# Patient Record
Sex: Female | Born: 1960 | Race: White | Hispanic: No | Smoking: Current some day smoker
Health system: Southern US, Community
[De-identification: ages and names within clinical notes are randomized; demographics above are authoritative.]

## PROBLEM LIST (undated history)

## (undated) DIAGNOSIS — N200 Calculus of kidney: Secondary | ICD-10-CM

## (undated) DIAGNOSIS — M1990 Unspecified osteoarthritis, unspecified site: Secondary | ICD-10-CM

## (undated) DIAGNOSIS — C4491 Basal cell carcinoma of skin, unspecified: Secondary | ICD-10-CM

## (undated) HISTORY — PX: VAGINAL HYSTERECTOMY: SUR661

## (undated) HISTORY — PX: KNEE SURGERY: SHX244

## (undated) HISTORY — PX: JOINT REPLACEMENT: SHX530

## (undated) HISTORY — PX: KNEE ARTHROSCOPY: SHX127

---

## 1989-08-08 HISTORY — PX: LAPAROSCOPIC CHOLECYSTECTOMY: SUR755

## 2002-12-08 HISTORY — PX: TOTAL KNEE ARTHROPLASTY: SHX125

## 2014-12-04 ENCOUNTER — Other Ambulatory Visit (HOSPITAL_COMMUNITY): Payer: Self-pay | Admitting: Orthopedic Surgery

## 2014-12-08 HISTORY — PX: SKIN CANCER EXCISION: SHX779

## 2014-12-14 NOTE — Pre-Procedure Instructions (Signed)
Theresa Michael  12/14/2014   Your procedure is scheduled on: Wednesday, Jan. 20th    Report to [facility] Admitting at 6:30 AM.   Call this number if you have problems the morning of surgery: [phone]   Remember:   Do not eat food or drink liquids after midnight Tuesday.   Take these medicines the morning of surgery with A SIP OF WATER: Effexor   Do not wear jewelry, make-up or nail polish.  Do not wear lotions, powders, or perfumes. You may NOT wear deodorant the day of surgery.  Do not shave underarms & legs 48 hours prior to surgery.   Do not bring valuables to the hospital.  [facility] is not responsible for any belongings or valuables.               Contacts, dentures or bridgework may not be worn into surgery.  Leave suitcase in the car. After surgery it may be brought to your room.  For patients admitted to the hospital, discharge time is determined by your treatment team.               Name and phone number of your driver:    Special Instructions: "Preparing for Surgery" instruction sheet.   Please read over the following fact sheets that you were given: Pain Booklet, Coughing and Deep Breathing, MRSA Information and Surgical Site Infection Prevention

## 2014-12-15 ENCOUNTER — Encounter (HOSPITAL_COMMUNITY)
Admission: RE | Admit: 2014-12-15 | Discharge: 2014-12-15 | Disposition: A | Discharge status: Home / Self Care | Payer: BLUE CROSS/BLUE SHIELD | Source: Ambulatory Visit | Attending: Orthopedic Surgery | Admitting: Orthopedic Surgery

## 2014-12-15 ENCOUNTER — Encounter (HOSPITAL_COMMUNITY): Payer: Self-pay

## 2014-12-15 DIAGNOSIS — T84033A Mechanical loosening of internal left knee prosthetic joint, initial encounter: Secondary | ICD-10-CM | POA: Insufficient documentation

## 2014-12-15 DIAGNOSIS — Z01818 Encounter for other preprocedural examination: Secondary | ICD-10-CM | POA: Insufficient documentation

## 2014-12-15 HISTORY — DX: Calculus of kidney: N20.0

## 2014-12-15 LAB — COMPREHENSIVE METABOLIC PANEL
ALBUMIN: 4.4 g/dL (ref 3.5–5.2)
ALT: 20 U/L (ref 0–35)
AST: 16 U/L (ref 0–37)
Alkaline Phosphatase: 111 U/L (ref 39–117)
Anion gap: 11 (ref 5–15)
BUN: 11 mg/dL (ref 6–23)
CO2: 23 mmol/L (ref 19–32)
Calcium: 9.7 mg/dL (ref 8.4–10.5)
Chloride: 105 mEq/L (ref 96–112)
Creatinine, Ser: 0.81 mg/dL (ref 0.50–1.10)
GFR calc Af Amer: >90 mL/min (ref >90)
GFR calc non Af Amer: 80 mL/min — ABNORMAL LOW (ref >90)
Glucose, Bld: 88 mg/dL (ref 70–99)
Potassium: 4.2 mmol/L (ref 3.5–5.1)
SODIUM: 139 mmol/L (ref 135–145)
TOTAL PROTEIN: 7.3 g/dL (ref 6.0–8.3)
Total Bilirubin: 0.6 mg/dL (ref 0.3–1.2)

## 2014-12-15 LAB — CBC
HCT: 44.8 % (ref 36.0–46.0)
Hemoglobin: 15.3 g/dL — ABNORMAL HIGH (ref 12.0–15.0)
MCH: 31.6 pg (ref 26.0–34.0)
MCHC: 34.2 g/dL (ref 30.0–36.0)
MCV: 92.6 fL (ref 78.0–100.0)
PLATELETS: 341 10*3/uL (ref 150–400)
RBC: 4.84 MIL/uL (ref 3.87–5.11)
RDW: 12.8 % (ref 11.5–15.5)
WBC: 10.2 10*3/uL (ref 4.0–10.5)

## 2014-12-15 LAB — PROTIME-INR
INR: 0.97 (ref 0.00–1.49)
Prothrombin Time: 13 seconds (ref 11.6–15.2)

## 2014-12-15 LAB — SURGICAL PCR SCREEN
MRSA, PCR: NEGATIVE
STAPHYLOCOCCUS AUREUS: NEGATIVE

## 2014-12-15 LAB — APTT: APTT: 37 s (ref 24–37)

## 2014-12-15 NOTE — Progress Notes (Signed)
Denies any cardiac issues.

## 2014-12-26 MED ORDER — CEFAZOLIN SODIUM-DEXTROSE 2-3 GM-% IV SOLR
2.0000 g | INTRAVENOUS | Status: AC
  Administered 2014-12-27: 2 g via INTRAVENOUS
  Filled 2014-12-26: qty 50

## 2014-12-27 ENCOUNTER — Encounter (HOSPITAL_COMMUNITY)
Admission: RE | Disposition: A | Discharge status: Home / Self Care | Payer: Self-pay | Source: Ambulatory Visit | Attending: Orthopedic Surgery

## 2014-12-27 ENCOUNTER — Inpatient Hospital Stay (HOSPITAL_COMMUNITY): Payer: BLUE CROSS/BLUE SHIELD | Admitting: Anesthesiology

## 2014-12-27 ENCOUNTER — Inpatient Hospital Stay (HOSPITAL_COMMUNITY)
Admission: RE | Admit: 2014-12-27 | Discharge: 2014-12-28 | DRG: 468 | Disposition: A | Discharge status: Home / Self Care | Payer: BLUE CROSS/BLUE SHIELD | Source: Ambulatory Visit | Attending: Orthopedic Surgery | Admitting: Orthopedic Surgery

## 2014-12-27 ENCOUNTER — Encounter (HOSPITAL_COMMUNITY): Payer: Self-pay | Admitting: *Deleted

## 2014-12-27 DIAGNOSIS — Y792 Prosthetic and other implants, materials and accessory orthopedic devices associated with adverse incidents: Secondary | ICD-10-CM | POA: Diagnosis present

## 2014-12-27 DIAGNOSIS — M25562 Pain in left knee: Secondary | ICD-10-CM | POA: Diagnosis present

## 2014-12-27 DIAGNOSIS — Z85828 Personal history of other malignant neoplasm of skin: Secondary | ICD-10-CM

## 2014-12-27 DIAGNOSIS — F17210 Nicotine dependence, cigarettes, uncomplicated: Secondary | ICD-10-CM | POA: Diagnosis present

## 2014-12-27 DIAGNOSIS — Z886 Allergy status to analgesic agent status: Secondary | ICD-10-CM | POA: Diagnosis not present

## 2014-12-27 DIAGNOSIS — Z87442 Personal history of urinary calculi: Secondary | ICD-10-CM | POA: Diagnosis not present

## 2014-12-27 DIAGNOSIS — Z9049 Acquired absence of other specified parts of digestive tract: Secondary | ICD-10-CM | POA: Diagnosis present

## 2014-12-27 DIAGNOSIS — T84093A Other mechanical complication of internal left knee prosthesis, initial encounter: Secondary | ICD-10-CM | POA: Diagnosis present

## 2014-12-27 DIAGNOSIS — Z882 Allergy status to sulfonamides status: Secondary | ICD-10-CM | POA: Diagnosis not present

## 2014-12-27 DIAGNOSIS — M1712 Unilateral primary osteoarthritis, left knee: Secondary | ICD-10-CM | POA: Diagnosis present

## 2014-12-27 DIAGNOSIS — Z90710 Acquired absence of both cervix and uterus: Secondary | ICD-10-CM | POA: Diagnosis not present

## 2014-12-27 DIAGNOSIS — Z96659 Presence of unspecified artificial knee joint: Secondary | ICD-10-CM

## 2014-12-27 HISTORY — PX: TOTAL KNEE REVISION: SHX996

## 2014-12-27 HISTORY — DX: Unspecified osteoarthritis, unspecified site: M19.90

## 2014-12-27 HISTORY — DX: Basal cell carcinoma of skin, unspecified: C44.91

## 2014-12-27 SURGERY — TOTAL KNEE REVISION
Anesthesia: Regional | Laterality: Left

## 2014-12-27 MED ORDER — HYDROMORPHONE HCL 1 MG/ML IJ SOLN
INTRAMUSCULAR | Status: AC
  Filled 2014-12-27: qty 1

## 2014-12-27 MED ORDER — FENTANYL CITRATE 0.05 MG/ML IJ SOLN
INTRAMUSCULAR | Status: DC | PRN
  Administered 2014-12-27: 50 ug via INTRAVENOUS
  Administered 2014-12-27: 100 ug via INTRAVENOUS
  Administered 2014-12-27 (×2): 50 ug via INTRAVENOUS

## 2014-12-27 MED ORDER — PROPOFOL 10 MG/ML IV BOLUS
INTRAVENOUS | Status: AC
  Filled 2014-12-27: qty 20

## 2014-12-27 MED ORDER — ASPIRIN EC 325 MG PO TBEC
325.0000 mg | DELAYED_RELEASE_TABLET | Freq: Every day | ORAL | Status: DC
  Administered 2014-12-28: 325 mg via ORAL
  Filled 2014-12-27 (×2): qty 1

## 2014-12-27 MED ORDER — ONDANSETRON HCL 4 MG PO TABS: 4.0000 mg | ORAL_TABLET | Freq: Four times a day (QID) | ORAL | Status: DC | PRN

## 2014-12-27 MED ORDER — ROCURONIUM BROMIDE 50 MG/5ML IV SOLN
INTRAVENOUS | Status: AC
  Filled 2014-12-27: qty 1

## 2014-12-27 MED ORDER — SODIUM CHLORIDE 0.9 % IV SOLN
INTRAVENOUS | Status: DC
  Administered 2014-12-27: 16:00:00 via INTRAVENOUS

## 2014-12-27 MED ORDER — METHOCARBAMOL 1000 MG/10ML IJ SOLN
500.0000 mg | INTRAVENOUS | Status: DC
  Filled 2014-12-27: qty 5

## 2014-12-27 MED ORDER — METHOCARBAMOL 500 MG PO TABS
ORAL_TABLET | ORAL | Status: AC
  Filled 2014-12-27: qty 1

## 2014-12-27 MED ORDER — LIDOCAINE HCL (CARDIAC) 20 MG/ML IV SOLN
INTRAVENOUS | Status: DC | PRN
  Administered 2014-12-27: 80 mg via INTRAVENOUS
  Administered 2014-12-27: 20 mg via INTRAVENOUS

## 2014-12-27 MED ORDER — PHENYLEPHRINE HCL 10 MG/ML IJ SOLN
10.0000 mg | INTRAVENOUS | Status: DC | PRN
  Administered 2014-12-27: 20 ug/min via INTRAVENOUS

## 2014-12-27 MED ORDER — METOCLOPRAMIDE HCL 10 MG PO TABS: 5.0000 mg | ORAL_TABLET | Freq: Three times a day (TID) | ORAL | Status: DC | PRN

## 2014-12-27 MED ORDER — PHENYLEPHRINE 40 MCG/ML (10ML) SYRINGE FOR IV PUSH (FOR BLOOD PRESSURE SUPPORT)
PREFILLED_SYRINGE | INTRAVENOUS | Status: AC
  Filled 2014-12-27: qty 10

## 2014-12-27 MED ORDER — GLYCOPYRROLATE 0.2 MG/ML IJ SOLN
INTRAMUSCULAR | Status: DC | PRN
  Administered 2014-12-27: 0.2 mg via INTRAVENOUS
  Administered 2014-12-27: 0.6 mg via INTRAVENOUS

## 2014-12-27 MED ORDER — DEXTROSE 5 % IV SOLN: 500.0000 mg | Freq: Four times a day (QID) | INTRAVENOUS | Status: DC | PRN

## 2014-12-27 MED ORDER — MAGNESIUM CITRATE PO SOLN: 1.0000 | Freq: Once | ORAL | Status: AC | PRN

## 2014-12-27 MED ORDER — HYDROMORPHONE HCL 1 MG/ML IJ SOLN
0.2500 mg | INTRAMUSCULAR | Status: DC | PRN
  Administered 2014-12-27 (×4): 0.5 mg via INTRAVENOUS

## 2014-12-27 MED ORDER — NEOSTIGMINE METHYLSULFATE 10 MG/10ML IV SOLN
INTRAVENOUS | Status: DC | PRN
  Administered 2014-12-27: 4 mg via INTRAVENOUS

## 2014-12-27 MED ORDER — SODIUM CHLORIDE 0.9 % IR SOLN
Status: DC | PRN
  Administered 2014-12-27 (×5): 1000 mL

## 2014-12-27 MED ORDER — GLYCOPYRROLATE 0.2 MG/ML IJ SOLN
INTRAMUSCULAR | Status: AC
  Filled 2014-12-27: qty 3

## 2014-12-27 MED ORDER — HYDROMORPHONE HCL 1 MG/ML IJ SOLN
INTRAMUSCULAR | Status: AC
  Administered 2014-12-27: 0.5 mg via INTRAVENOUS
  Filled 2014-12-27: qty 1

## 2014-12-27 MED ORDER — ONDANSETRON HCL 4 MG/2ML IJ SOLN
INTRAMUSCULAR | Status: DC | PRN
  Administered 2014-12-27: 4 mg via INTRAVENOUS

## 2014-12-27 MED ORDER — NEOSTIGMINE METHYLSULFATE 10 MG/10ML IV SOLN
INTRAVENOUS | Status: AC
  Filled 2014-12-27: qty 1

## 2014-12-27 MED ORDER — ONDANSETRON HCL 4 MG/2ML IJ SOLN
INTRAMUSCULAR | Status: AC
  Filled 2014-12-27: qty 2

## 2014-12-27 MED ORDER — OXYCODONE HCL 5 MG PO TABS
5.0000 mg | ORAL_TABLET | ORAL | Status: DC | PRN
  Administered 2014-12-27 – 2014-12-28 (×4): 10 mg via ORAL
  Filled 2014-12-27 (×3): qty 2

## 2014-12-27 MED ORDER — ACETAMINOPHEN 650 MG RE SUPP: 650.0000 mg | Freq: Four times a day (QID) | RECTAL | Status: DC | PRN

## 2014-12-27 MED ORDER — VENLAFAXINE HCL ER 75 MG PO CP24
75.0000 mg | ORAL_CAPSULE | Freq: Every day | ORAL | Status: DC
  Administered 2014-12-28: 75 mg via ORAL
  Filled 2014-12-27 (×2): qty 1

## 2014-12-27 MED ORDER — ONDANSETRON HCL 4 MG/2ML IJ SOLN: 4.0000 mg | Freq: Four times a day (QID) | INTRAMUSCULAR | Status: DC | PRN

## 2014-12-27 MED ORDER — ACETAMINOPHEN 325 MG PO TABS
ORAL_TABLET | ORAL | Status: AC
  Filled 2014-12-27: qty 2

## 2014-12-27 MED ORDER — METHOCARBAMOL 500 MG PO TABS
500.0000 mg | ORAL_TABLET | Freq: Four times a day (QID) | ORAL | Status: DC | PRN
  Administered 2014-12-27 – 2014-12-28 (×3): 500 mg via ORAL
  Filled 2014-12-27 (×2): qty 1

## 2014-12-27 MED ORDER — METOCLOPRAMIDE HCL 5 MG/ML IJ SOLN
5.0000 mg | Freq: Three times a day (TID) | INTRAMUSCULAR | Status: DC | PRN
Start: 2014-12-27 — End: 2014-12-28
  Administered 2014-12-27 – 2014-12-28 (×2): 10 mg via INTRAVENOUS
  Filled 2014-12-27 (×2): qty 2

## 2014-12-27 MED ORDER — OXYCODONE HCL 5 MG/5ML PO SOLN
5.0000 mg | Freq: Once | ORAL | Status: DC | PRN
Start: 2014-12-27 — End: 2014-12-27

## 2014-12-27 MED ORDER — LIDOCAINE HCL (CARDIAC) 20 MG/ML IV SOLN
INTRAVENOUS | Status: AC
  Filled 2014-12-27: qty 5

## 2014-12-27 MED ORDER — HYDROMORPHONE HCL 1 MG/ML IJ SOLN
1.0000 mg | INTRAMUSCULAR | Status: DC | PRN
  Administered 2014-12-27 – 2014-12-28 (×4): 1 mg via INTRAVENOUS
  Filled 2014-12-27 (×5): qty 1

## 2014-12-27 MED ORDER — MENTHOL 3 MG MT LOZG: 1.0000 | LOZENGE | OROMUCOSAL | Status: DC | PRN

## 2014-12-27 MED ORDER — MIDAZOLAM HCL 5 MG/5ML IJ SOLN
INTRAMUSCULAR | Status: DC | PRN
  Administered 2014-12-27: 2 mg via INTRAVENOUS

## 2014-12-27 MED ORDER — 0.9 % SODIUM CHLORIDE (POUR BTL) OPTIME
TOPICAL | Status: DC | PRN
  Administered 2014-12-27: 1000 mL

## 2014-12-27 MED ORDER — FENTANYL CITRATE 0.05 MG/ML IJ SOLN
INTRAMUSCULAR | Status: AC
  Filled 2014-12-27: qty 5

## 2014-12-27 MED ORDER — CEFAZOLIN SODIUM 1-5 GM-% IV SOLN
1.0000 g | Freq: Four times a day (QID) | INTRAVENOUS | Status: AC
  Administered 2014-12-27 (×2): 1 g via INTRAVENOUS
  Filled 2014-12-27 (×2): qty 50

## 2014-12-27 MED ORDER — ALBUMIN HUMAN 5 % IV SOLN
INTRAVENOUS | Status: DC | PRN
  Administered 2014-12-27: 09:00:00 via INTRAVENOUS

## 2014-12-27 MED ORDER — BUPIVACAINE LIPOSOME 1.3 % IJ SUSP
20.0000 mL | INTRAMUSCULAR | Status: AC
  Administered 2014-12-27: 20 mL
  Filled 2014-12-27: qty 20

## 2014-12-27 MED ORDER — SODIUM CHLORIDE 0.9 % IJ SOLN
INTRAMUSCULAR | Status: AC
  Filled 2014-12-27: qty 10

## 2014-12-27 MED ORDER — PHENOL 1.4 % MT LIQD: 1.0000 | OROMUCOSAL | Status: DC | PRN

## 2014-12-27 MED ORDER — SUCCINYLCHOLINE CHLORIDE 20 MG/ML IJ SOLN
INTRAMUSCULAR | Status: AC
  Filled 2014-12-27: qty 1

## 2014-12-27 MED ORDER — SUCCINYLCHOLINE CHLORIDE 20 MG/ML IJ SOLN
INTRAMUSCULAR | Status: DC | PRN
  Administered 2014-12-27: 100 mg via INTRAVENOUS

## 2014-12-27 MED ORDER — OXYCODONE HCL 5 MG PO TABS: 5.0000 mg | ORAL_TABLET | Freq: Once | ORAL | Status: DC | PRN

## 2014-12-27 MED ORDER — ROCURONIUM BROMIDE 100 MG/10ML IV SOLN
INTRAVENOUS | Status: DC | PRN
  Administered 2014-12-27: 50 mg via INTRAVENOUS

## 2014-12-27 MED ORDER — PROPOFOL 10 MG/ML IV BOLUS
INTRAVENOUS | Status: DC | PRN
  Administered 2014-12-27: 200 mg via INTRAVENOUS

## 2014-12-27 MED ORDER — EPHEDRINE SULFATE 50 MG/ML IJ SOLN
INTRAMUSCULAR | Status: AC
  Filled 2014-12-27: qty 1

## 2014-12-27 MED ORDER — BISACODYL 5 MG PO TBEC: 5.0000 mg | DELAYED_RELEASE_TABLET | Freq: Every day | ORAL | Status: DC | PRN

## 2014-12-27 MED ORDER — DOCUSATE SODIUM 100 MG PO CAPS
100.0000 mg | ORAL_CAPSULE | Freq: Two times a day (BID) | ORAL | Status: DC
  Administered 2014-12-27 – 2014-12-28 (×3): 100 mg via ORAL
  Filled 2014-12-27 (×2): qty 1

## 2014-12-27 MED ORDER — PHENYLEPHRINE HCL 10 MG/ML IJ SOLN
INTRAMUSCULAR | Status: DC | PRN
  Administered 2014-12-27: 80 ug via INTRAVENOUS
  Administered 2014-12-27: 40 ug via INTRAVENOUS
  Administered 2014-12-27 (×3): 80 ug via INTRAVENOUS
  Administered 2014-12-27: 40 ug via INTRAVENOUS

## 2014-12-27 MED ORDER — SENNOSIDES-DOCUSATE SODIUM 8.6-50 MG PO TABS: 1.0000 | ORAL_TABLET | Freq: Every evening | ORAL | Status: DC | PRN

## 2014-12-27 MED ORDER — CHLORHEXIDINE GLUCONATE 4 % EX LIQD
60.0000 mL | Freq: Once | CUTANEOUS | Status: DC
  Filled 2014-12-27: qty 60

## 2014-12-27 MED ORDER — ACETAMINOPHEN 325 MG PO TABS
650.0000 mg | ORAL_TABLET | Freq: Four times a day (QID) | ORAL | Status: DC | PRN
  Administered 2014-12-27: 650 mg via ORAL

## 2014-12-27 MED ORDER — OXYCODONE HCL 5 MG PO TABS
ORAL_TABLET | ORAL | Status: AC
  Filled 2014-12-27: qty 2

## 2014-12-27 MED ORDER — LACTATED RINGERS IV SOLN
INTRAVENOUS | Status: DC | PRN
  Administered 2014-12-27 (×3): via INTRAVENOUS

## 2014-12-27 MED ORDER — ONDANSETRON HCL 4 MG/2ML IJ SOLN
4.0000 mg | Freq: Four times a day (QID) | INTRAMUSCULAR | Status: DC | PRN
  Administered 2014-12-27: 4 mg via INTRAVENOUS
  Filled 2014-12-27: qty 2

## 2014-12-27 MED ORDER — BUPIVACAINE-EPINEPHRINE (PF) 0.5% -1:200000 IJ SOLN
INTRAMUSCULAR | Status: DC | PRN
  Administered 2014-12-27: 30 mL via PERINEURAL

## 2014-12-27 MED ORDER — ARTIFICIAL TEARS OP OINT
TOPICAL_OINTMENT | OPHTHALMIC | Status: DC | PRN
  Administered 2014-12-27: 1 via OPHTHALMIC

## 2014-12-27 MED ORDER — MIDAZOLAM HCL 2 MG/2ML IJ SOLN
INTRAMUSCULAR | Status: AC
  Filled 2014-12-27: qty 2

## 2014-12-27 MED ORDER — ARTIFICIAL TEARS OP OINT
TOPICAL_OINTMENT | OPHTHALMIC | Status: AC
  Filled 2014-12-27: qty 3.5

## 2014-12-27 SURGICAL SUPPLY — 58 items
BLADE SAW SAG 90X13X1.27 (BLADE) ×2 IMPLANT
BLADE SAW SGTL 13.0X1.19X90.0M (BLADE) IMPLANT
BLADE SAW SGTL 81X20 HD (BLADE) ×2 IMPLANT
BLADE SURG 10 STRL SS (BLADE) ×8 IMPLANT
BNDG COHESIVE 6X5 TAN STRL LF (GAUZE/BANDAGES/DRESSINGS) ×2 IMPLANT
BNDG GAUZE ELAST 4 BULKY (GAUZE/BANDAGES/DRESSINGS) ×2 IMPLANT
BONE CEMENT PALACOSE (Orthopedic Implant) ×4 IMPLANT
BOWL SMART MIX CTS (DISPOSABLE) ×2 IMPLANT
CEMENT BONE PALACOSE (Orthopedic Implant) ×2 IMPLANT
CONNECTOR OFFSET KNEE 3MM (Knees) ×2 IMPLANT
COVER BACK TABLE 24X17X13 BIG (DRAPES) IMPLANT
COVER SURGICAL LIGHT HANDLE (MISCELLANEOUS) ×2 IMPLANT
CUFF TOURNIQUET SINGLE 34IN LL (TOURNIQUET CUFF) ×2 IMPLANT
CUFF TOURNIQUET SINGLE 44IN (TOURNIQUET CUFF) IMPLANT
DRAPE EXTREMITY T 121X128X90 (DRAPE) ×2 IMPLANT
DRAPE IMP U-DRAPE 54X76 (DRAPES) ×2 IMPLANT
DRAPE PROXIMA HALF (DRAPES) ×2 IMPLANT
DRAPE U-SHAPE 47X51 STRL (DRAPES) ×2 IMPLANT
DRSG ADAPTIC 3X8 NADH LF (GAUZE/BANDAGES/DRESSINGS) ×2 IMPLANT
DRSG PAD ABDOMINAL 8X10 ST (GAUZE/BANDAGES/DRESSINGS) ×2 IMPLANT
DURAPREP 26ML APPLICATOR (WOUND CARE) ×2 IMPLANT
ELECT REM PT RETURN 9FT ADLT (ELECTROSURGICAL) ×2
ELECTRODE REM PT RTRN 9FT ADLT (ELECTROSURGICAL) ×1 IMPLANT
FACESHIELD WRAPAROUND (MASK) ×2 IMPLANT
GAUZE SPONGE 4X4 12PLY STRL (GAUZE/BANDAGES/DRESSINGS) ×2 IMPLANT
GLOVE BIOGEL PI IND STRL 9 (GLOVE) ×1 IMPLANT
GLOVE BIOGEL PI INDICATOR 9 (GLOVE) ×1
GLOVE SURG ORTHO 9.0 STRL STRW (GLOVE) ×2 IMPLANT
GOWN STRL REUS W/ TWL XL LVL3 (GOWN DISPOSABLE) ×3 IMPLANT
GOWN STRL REUS W/TWL XL LVL3 (GOWN DISPOSABLE) ×3
HANDPIECE INTERPULSE COAX TIP (DISPOSABLE) ×1
INSERT TIBIAL KNEE FIX SZ3 17 (Knees) ×2 IMPLANT
KIT BASIN OR (CUSTOM PROCEDURE TRAY) ×2 IMPLANT
KIT ROOM TURNOVER OR (KITS) ×2 IMPLANT
KNEE FEMORAL COMPONENT SZ2 LT (Knees) ×2 IMPLANT
MANIFOLD NEPTUNE II (INSTRUMENTS) ×2 IMPLANT
NEEDLE SPNL 18GX3.5 QUINCKE PK (NEEDLE) ×2 IMPLANT
NS IRRIG 1000ML POUR BTL (IV SOLUTION) ×2 IMPLANT
PACK TOTAL JOINT (CUSTOM PROCEDURE TRAY) ×2 IMPLANT
PACK UNIVERSAL I (CUSTOM PROCEDURE TRAY) ×2 IMPLANT
PAD ARMBOARD 7.5X6 YLW CONV (MISCELLANEOUS) ×4 IMPLANT
PADDING CAST COTTON 6X4 STRL (CAST SUPPLIES) ×2 IMPLANT
SET HNDPC FAN SPRY TIP SCT (DISPOSABLE) ×1 IMPLANT
STAPLER VISISTAT 35W (STAPLE) ×2 IMPLANT
STEM EXT FLUTED KNEE 15X105MM (Knees) ×2 IMPLANT
STEM EXTEND KNEE FLUTED 12MM (Stem) ×2 IMPLANT
SUCTION FRAZIER TIP 10 FR DISP (SUCTIONS) IMPLANT
SUT VIC AB 0 CTB1 27 (SUTURE) IMPLANT
SUT VIC AB 1 CTX 36 (SUTURE)
SUT VIC AB 1 CTX36XBRD ANBCTR (SUTURE) IMPLANT
SUT VIC AB 2-0 CTB1 (SUTURE) IMPLANT
TOWEL OR 17X24 6PK STRL BLUE (TOWEL DISPOSABLE) ×2 IMPLANT
TOWEL OR 17X26 10 PK STRL BLUE (TOWEL DISPOSABLE) ×2 IMPLANT
TRAY FOLEY CATH 16FRSI W/METER (SET/KITS/TRAYS/PACK) IMPLANT
TRAY TIBIAL KNEE FIXED SZ 3 LT (Knees) ×2 IMPLANT
TUBE ANAEROBIC SPECIMEN COL (MISCELLANEOUS) IMPLANT
WATER STERILE IRR 1000ML POUR (IV SOLUTION) IMPLANT
WRAP KNEE MAXI GEL POST OP (GAUZE/BANDAGES/DRESSINGS) ×2 IMPLANT

## 2014-12-27 NOTE — Progress Notes (Signed)
Orthopedic Tech Progress Note Patient Details:  Theresa Michael 01-Nov-1961 [id number]  Ortho Devices Ortho Device/Splint Location: footsie roll LLe Ortho Device/Splint Interventions: Ordered, Application   Braulio Bosch 12/27/2014, 2:42 PM

## 2014-12-27 NOTE — Plan of Care (Signed)
Problem: Consults Goal: Diagnosis- Total Joint Replacement Primary Total Knee Left     

## 2014-12-27 NOTE — Anesthesia Postprocedure Evaluation (Signed)
Anesthesia Post Note  [name]  Procedure(s) Performed: Procedure(s) (LRB): TOTAL KNEE REVISION (Left)  Anesthesia type: General  Patient location: PACU  Post pain: Pain level controlled and Adequate analgesia  Post assessment: Post-op Vital signs reviewed, Patient's Cardiovascular Status Stable, Respiratory Function Stable, Patent Airway and Pain level controlled  Last Vitals:  Filed Vitals:   12/27/14 1215  BP: 91/53  Pulse: 68  Temp:   Resp: 14    Post vital signs: Reviewed and stable  Level of consciousness: awake, alert  and oriented  Complications: No apparent anesthesia complications

## 2014-12-27 NOTE — Op Note (Signed)
12/27/2014  10:29 AM  PATIENT:  Theresa Michael    PRE-OPERATIVE DIAGNOSIS:  Loose Left Total Knee Arthroplasty  POST-OPERATIVE DIAGNOSIS:  Same  PROCEDURE:  TOTAL KNEE REVISION Removal of total knee arthroplasty  SURGEON:  Newt Minion, MD  PHYSICIAN ASSISTANT:None ANESTHESIA:   General  PREOPERATIVE INDICATIONS:  Theresa Michael is a  54 y.o. female with a diagnosis of Loose Left Total Knee Arthroplasty who failed conservative measures and elected for surgical management.    The risks benefits and alternatives were discussed with the patient preoperatively including but not limited to the risks of infection, bleeding, nerve injury, cardiopulmonary complications, the need for revision surgery, among others, and the patient was willing to proceed.  OPERATIVE IMPLANTS: Medacta  size 2 femur size 3 tibia 11 mm tibial stem by 105 mm, 105 mm femoral stem by 15 mm, 17 mm polyethylene tray  OPERATIVE FINDINGS: Loose implants no signs of infection  OPERATIVE PROCEDURE: Patient was brought to the operating room after undergoing a femoral block. She then underwent a general anesthetic. After adequate levels of anesthesia were obtained patient's left lower extremity was prepped using DuraPrep draped into a sterile field Ioban was used to cover all exposed skin. A timeout was called. Her previous midline incision was used this was carried down to a medial parapatellar retinacular incision. The patella was everted. Using a saw and osteotomes the femoral and tibial components were removed without complications. Intramedullary guide was used first on the tibia. This was sequentially broached to an 11 mm tibia. Trial tibial cuts were then made and 2 mm was taken off the tibia. This was resurfaced and the keel punches were made the offset was made with 3 mm offset and 240 offset. Tibial component was left in place. Intramedullary guide was then used in the femur and this was broached up to a size 15  stem the distal cut of 4 mm was made off the intramedullary guide box cuts and chamfer cuts were then made. Trial component was placed knee was stable with a 17 mm polyethylene tray. Trial components were removed. All debris was removed. Wound was irrigated with pulsatile lavage. Popliteal fossa was injected with 20 mL of drill. Care was taken not to have an intravascular injection. The tibial followed by the femoral components were cemented in place the polyethylene tray was placed and the knee was left in extension until the cement hardened. All loose cement was removed. The knee was placed a full range of motion the patella tracked midline collateral ligaments were stable. Retinaculum was closed using #1 Vicryl. Subcutaneous is closed using 0 Vicryl. Skin was closed using staples. A sterile compressive dressing was applied. Patient was extubated taken to the PACU in stable condition.

## 2014-12-27 NOTE — Anesthesia Procedure Notes (Addendum)
Procedure Name: Intubation Date/Time: 12/27/2014 8:34 AM Performed by: Scheryl Darter Pre-anesthesia Checklist: Patient identified, Emergency Drugs available, Suction available, Patient being monitored and Timeout performed Patient Re-evaluated:Patient Re-evaluated prior to inductionOxygen Delivery Method: Circle system utilized Preoxygenation: Pre-oxygenation with 100% oxygen Intubation Type: IV induction Ventilation: Mask ventilation without difficulty Laryngoscope Size: Mac and 3 Grade View: Grade I Tube type: Oral Tube size: 7.5 mm Number of attempts: 1 Airway Equipment and Method: Stylet Placement Confirmation: ETT inserted through vocal cords under direct vision,  positive ETCO2 and breath sounds checked- equal and bilateral Secured at: 21 cm Tube secured with: Tape Dental Injury: Teeth and Oropharynx as per pre-operative assessment    Anesthesia Regional Block:  Femoral nerve block  Pre-Anesthetic Checklist: ,, timeout performed, Correct Patient, Correct Site, Correct Laterality, Correct Procedure,, site marked, risks and benefits discussed, Surgical consent,  Pre-op evaluation,  At surgeon's request and post-op pain management  Laterality: Left  Prep: chloraprep       Needles:  Injection technique: Single-shot  Needle Type: Echogenic Stimulator Needle     Needle Length: 9cm 9 cm Needle Gauge: 21 and 21 G    Additional Needles:  Procedures: nerve stimulator Femoral nerve block  Nerve Stimulator or Paresthesia:  Response: Quadriceps muscle contraction, 0.45 mA,   Additional Responses:   Narrative:  Start time: 12/27/2014 8:04 AM Injection made incrementally with aspirations every 5 mL.  Performed by: Personally  Anesthesiologist: Padraic Marinos  Additional Notes: Functioning IV was confirmed and monitors were applied.  A 65mm 21ga Arrow echogenic stimulator needle was used. Sterile prep and drape,hand hygiene and sterile gloves were used.  Negative  aspiration and negative test dose prior to incremental administration of local anesthetic. The patient tolerated the procedure well.

## 2014-12-27 NOTE — Anesthesia Preprocedure Evaluation (Signed)
Anesthesia Evaluation  Patient identified by MRN, date of birth, ID band Patient awake    Reviewed: Allergy & Precautions, NPO status , Patient's Chart, lab work & pertinent test results  Airway Mallampati: II   Neck ROM: full    Dental   Pulmonary Current Smoker,          Cardiovascular negative cardio ROS      Neuro/Psych    GI/Hepatic   Endo/Other    Renal/GU      Musculoskeletal  (+) Arthritis -,   Abdominal   Peds  Hematology   Anesthesia Other Findings   Reproductive/Obstetrics                             Anesthesia Physical Anesthesia Plan  ASA: II  Anesthesia Plan: General and Regional   Post-op Pain Management: MAC Combined w/ Regional for Post-op pain   Induction: Intravenous  Airway Management Planned: Oral ETT  Additional Equipment:   Intra-op Plan:   Post-operative Plan: Extubation in OR  Informed Consent: I have reviewed the patients History and Physical, chart, labs and discussed the procedure including the risks, benefits and alternatives for the proposed anesthesia with the patient or authorized representative who has indicated his/her understanding and acceptance.     Plan Discussed with: CRNA, Anesthesiologist and Surgeon  Anesthesia Plan Comments:         Anesthesia Quick Evaluation

## 2014-12-27 NOTE — H&P (Signed)
TOTAL KNEE REVISION ADMISSION H&P  Patient is being admitted for left revision total knee arthroplasty.  Subjective:  Chief Complaint:left knee pain.  HPI: Theresa Michael, 54 y.o. female, has a history of pain and functional disability in the left knee(s) due to failed previous arthroplasty and patient has failed non-surgical conservative treatments for greater than 12 weeks to include NSAID's and/or analgesics, flexibility and strengthening excercises and activity modification. The indications for the revision of the total knee arthroplasty are loosening of one or more components. Onset of symptoms was gradual starting 8 years ago with gradually worsening course since that time.  Prior procedures on the left knee(s) include arthroplasty.  Patient currently rates pain in the left knee(s) at 8 out of 10 with activity. There is night pain, worsening of pain with activity and weight bearing, pain that interferes with activities of daily living, pain with passive range of motion and joint swelling.  Patient has evidence of prosthetic loosening by imaging studies. This condition presents safety issues increasing the risk of falls. This patient has had Failure total knee arthroplasty.  There is no current active infection.  There are no active problems to display for this patient.  Past Medical History  Diagnosis Date  . Kidney stones     years ago  . Cancer     skin cancers-- basal cell's    Past Surgical History  Procedure Laterality Date  . Abdominal hysterectomy    . Joint replacement      left knee in 2004  . Cholecystectomy      No prescriptions prior to admission   Allergies  Allergen Reactions  . Codeine Nausea And Vomiting  . Sulfa Antibiotics Itching    History  Substance Use Topics  . Smoking status: Current Some Day Smoker -- 0.50 packs/day for 32 years    Types: Cigarettes  . Smokeless tobacco: Not on file  . Alcohol Use: 2.4 oz/week    4 Shots of liquor per week   Comment: socially    No family history on file.    Review of Systems  All other systems reviewed and are negative.    Objective:  Physical Exam  Vital signs in last 24 hours:    Labs:  There is no height or weight on file to calculate BMI.  Imaging Review Plain radiographs demonstrate Loosening of the total knee arthroplasty degenerative joint disease of the left knee(s). The overall alignment is neutral.There is evidence of loosening of the femoral and tibial components. The bone quality appears to be adequate for age and reported activity level. There is loosening of the total knee arthroplasty.  Assessment/Plan:  End stage arthritis, left knee(s) with failed previous arthroplasty.   The patient history, physical examination, clinical judgment of the provider and imaging studies are consistent with end stage degenerative joint disease of the left knee(s), previous total knee arthroplasty. Revision total knee arthroplasty is deemed medically necessary. The treatment options including medical management, injection therapy, arthroscopy and revision arthroplasty were discussed at length. The risks and benefits of revision total knee arthroplasty were presented and reviewed. The risks due to aseptic loosening, infection, stiffness, patella tracking problems, thromboembolic complications and other imponderables were discussed. The patient acknowledged the explanation, agreed to proceed with the plan and consent was signed. Patient is being admitted for inpatient treatment for surgery, pain control, PT, OT, prophylactic antibiotics, VTE prophylaxis, progressive ambulation and ADL's and discharge planning.The patient is planning to be discharged home with home health services

## 2014-12-27 NOTE — Transfer of Care (Signed)
Immediate Anesthesia Transfer of Care Note  [name]  Procedure(s) Performed: Procedure(s): TOTAL KNEE REVISION (Left)  Patient Location: PACU  Anesthesia Type:General and Regional  Level of Consciousness: awake, alert , oriented and sedated  Airway & Oxygen Therapy: Patient Spontanous Breathing and Patient connected to nasal cannula oxygen  Post-op Assessment: Report given to PACU RN, Post -op Vital signs reviewed and stable and Patient moving all extremities  Post vital signs: Reviewed and stable  Complications: No apparent anesthesia complications

## 2014-12-28 ENCOUNTER — Encounter (HOSPITAL_COMMUNITY): Payer: Self-pay | Admitting: General Practice

## 2014-12-28 NOTE — Progress Notes (Signed)
12/28/14 Set up with [facility] for HHPT by MD office. Spoke with patient, no change in d/c plan. Contacted Frank at [facility] and requested rolling walker and 3N1 be delivered to patient's room. No other d/c needs identified.

## 2014-12-28 NOTE — Progress Notes (Signed)
Occupational Therapy Treatment Patient Details Name: Theresa Michael MRN: [id number] DOB: 02-Jan-1961 Today's Date: 12/28/2014    History of present illness s/p L TKA revision   OT comments  Completed all education for ADL and functional mobility. Pt asking questions regarding when she can shower and change her dressings. This information was relayed to Dr. Sharol Given, who will educate pt/caregiver today at the office when they pick up perscriptions. No further OT needs.   Follow Up Recommendations  No OT follow up;Supervision - Intermittent    Equipment Recommendations  3 in 1 bedside comode;Other (comment)    Recommendations for Other Services      Precautions / Restrictions Precautions Precautions: Knee Precaution Booklet Issued: Yes (comment) Precaution Comments: reviewed with pt who already had them previously Restrictions Weight Bearing Restrictions: No       Mobility Bed Mobility               General bed mobility comments: up when PT entered  Transfers Overall transfer level: Modified independent Equipment used: Rolling walker (2 wheeled)        General transfer comment: vc for hand placement    Balance Overall balance assessment: Modified Independent Sitting-balance support: Feet supported Sitting balance-Leahy Scale: Good                     ADL                                     Tub/Shower Transfer Details (indicate cue type and reason): Educated caregiver on transfer techniques for tub with use of 3 in 1 Functional mobility during ADLs: Supervision/safety General ADL Comments: caregiver/pt verbally demonstrate understanding of tub transfer techniques. Pt asking about when she is able to shower and dressing cahnges. Unable to find information in D/C orders. Called Dr. Sharol Given and left message to educate caregiver when he comes by office to pick up prescriptions.                                       Cognition    Behavior During Therapy: WFL for tasks assessed/performed Overall Cognitive Status: Within Functional Limits for tasks assessed                       Extremity/Trunk Assessment  Upper Extremity Assessment Upper Extremity Assessment: Overall WFL for tasks assessed   Lower Extremity Assessment Lower Extremity Assessment: LLE deficits/detail LLE Deficits / Details: new TKA with edema and limited ROM   Cervical / Trunk Assessment Cervical / Trunk Assessment: Normal                    Pertinent Vitals/ Pain       Pain Assessment: Faces Pain Score: 6  Faces Pain Scale: Hurts little more Pain Location: L knee Pain Intervention(s): Limited activity within patient's tolerance  Home Living Family/patient expects to be discharged to:: Private residence Living Arrangements: Spouse/significant other Available Help at Discharge: Family;Available 24 hours/day Type of Home: House Home Access: Stairs to enter [employer] of Steps: 6 Entrance Stairs-Rails: Can reach both Home Layout: Multi-level;Bed/bath upstairs Alternate Level Stairs-Number of Steps: 6 Alternate Level Stairs-Rails: Can reach both           Home Equipment: None  Prior Functioning/Environment Level of Independence: Independent            Frequency       Progress Toward Goals  OT Goals(current goals can now be found in the care plan section)   Goals  Acute Rehab OT Goals Patient Stated Goal: to go home OT Goal Formulation: With patient Time For Goal Achievement: 01/04/15 Potential to Achieve Goals: Good ADL Goals Pt Will Perform Tub/Shower Transfer: with caregiver independent in assisting;with min guard assist;ambulating;3 in 1;rolling walker;Tub transfer Additional ADL Goal #1: pt/caregiver verbalize understanding of home safety and need for reducing risk of falls.  Plan All goals met and education completed, patient discharged from OT services     Co-evaluation                 End of Session Equipment Utilized During Treatment: Rolling walker   Activity Tolerance Patient tolerated treatment well   Patient Left Other (comment) (w/c)   Nurse Communication Mobility status        Time: 8933-8826 OT Time Calculation (min): 13 min  Charges: OT General Charges $OT Visit: 1 Procedure OT Treatments $Self Care/Home Management : 8-22 mins  Theresa Michael,Theresa Michael 12/28/2014, 2:30 PM

## 2014-12-28 NOTE — Discharge Summary (Signed)
Physician Discharge Summary  Patient ID: Theresa Michael MRN: [id number] DOB/AGE: [birth year] 54 y.o.  Admit date: 12/27/2014 Discharge date: 12/28/2014  Admission Diagnoses: Failed left total knee arthroplasty  Discharge Diagnoses:  Active Problems:   S/P revision of total knee   Discharged Condition: stable  Hospital Course: Patient's hospital course was essentially unremarkable. She underwent revision of the left total knee arthroplasty. Postoperatively she progressed well and was discharged to home in stable condition.  Consults: None  Significant Diagnostic Studies: labs: Routine labs  Treatments: surgery: See operative note  Discharge Exam: Blood pressure 106/42, pulse 80, temperature 100 F (37.8 C), temperature source Oral, resp. rate 18, SpO2 98 %. Incision/Wound: dressing clean dry and intact  Disposition: 01-Home or Self Care  Discharge Instructions    Call MD / Call 911    Complete by:  As directed   If you experience chest pain or shortness of breath, CALL 911 and be transported to the hospital emergency room.  If you develope a fever above 101 F, pus (white drainage) or increased drainage or redness at the wound, or calf pain, call your surgeon's office.     Constipation Prevention    Complete by:  As directed   Drink plenty of fluids.  Prune juice may be helpful.  You may use a stool softener, such as Colace (over the counter) 100 mg twice a day.  Use MiraLax (over the counter) for constipation as needed.     Diet - low sodium heart healthy    Complete by:  As directed      Increase activity slowly as tolerated    Complete by:  As directed             Medication List    TAKE these medications        ibuprofen 200 MG tablet  Commonly known as:  ADVIL,MOTRIN  Take 200 mg by mouth daily as needed for headache, mild pain or moderate pain.     venlafaxine XR 37.5 MG 24 hr capsule  Commonly known as:  EFFEXOR-XR  Take 75 mg by mouth daily with breakfast.            Follow-up Information    Follow up with [facility].   Why:  They will contact you to schedule home physical therpy visits.    Contact information:   [address] [place] [address] [phone]       Follow up with Layann Bluett V, MD In 1 week.   Specialty:  Orthopedic Surgery   Contact information:   [place] [address] [phone]       Signed: Newt Minion 12/28/2014, 6:26 PM

## 2014-12-28 NOTE — Progress Notes (Signed)
Occupational Therapy Evaluation Patient Details Name: Theresa Michael MRN: [id number] DOB: October 18, 1961 Today's Date: 12/28/2014    History of Present Illness s/p L TKA revision   Clinical Impression   Pt making excellent progress. Mobilizing with S @ RW level.  Pt expressing desire to D/C this pm if able. Discussed with nsg and PT. PT plans to see pt and train on stairs. Will plan to see again this pm prior to D/C to complete education. If pt able to navigate stairs without difficulty, feel pt will be safe to D/C this pm if medically stable.     Follow Up Recommendations  No OT follow up;Supervision - Intermittent    Equipment Recommendations  3 in 1 bedside comode;Other (comment) (RW)    Recommendations for Other Services       Precautions / Restrictions Precautions Precautions: Knee Restrictions Weight Bearing Restrictions: No      Mobility Bed Mobility Overal bed mobility: Modified Independent                Transfers Overall transfer level: Needs assistance Equipment used: Rolling walker (2 wheeled) Transfers: Sit to/from Stand;Stand Pivot Transfers Sit to Stand: Supervision Stand pivot transfers: Supervision       General transfer comment: vc for hand placement    Balance Overall balance assessment: No apparent balance deficits (not formally assessed)                                          ADL Overall ADL's : Needs assistance/impaired             Lower Body Bathing: Minimal assistance;Sit to/from stand       Lower Body Dressing: Minimal assistance;Sit to/from stand   Toilet Transfer: Supervision/safety;Ambulation;Comfort height toilet;RW   Toileting- Clothing Manipulation and Hygiene: Modified independent;Sit to/from Nurse, children's Details (indicate cue type and reason): will educate on tub transfer techniques Functional mobility during ADLs: Supervision/safety;Rolling walker;Cueing for  sequencing General ADL Comments: Husband will assist with ADL. Educated on compensatory techiques and home safety. will benefit from 3 in 1      Vision                     Perception     Praxis      Pertinent Vitals/Pain Pain Assessment: 0-10 Pain Score: 6  Pain Location: L knee Pain Descriptors / Indicators: Aching;Grimacing Pain Intervention(s): Limited activity within patient's tolerance;Monitored during session;Repositioned;Ice applied;Patient requesting pain meds-RN notified     Hand Dominance     Extremity/Trunk Assessment Upper Extremity Assessment Upper Extremity Assessment: Overall WFL for tasks assessed   Lower Extremity Assessment Lower Extremity Assessment: Defer to PT evaluation   Cervical / Trunk Assessment Cervical / Trunk Assessment: Normal   Communication Communication Communication: No difficulties   Cognition Arousal/Alertness: Awake/alert Behavior During Therapy: WFL for tasks assessed/performed Overall Cognitive Status: Within Functional Limits for tasks assessed                     General Comments       Exercises       Shoulder Instructions      Home Living Family/patient expects to be discharged to:: Private residence Living Arrangements: Spouse/significant other Available Help at Discharge: Family;Available 24 hours/day Type of Home: House Home Access: Stairs to enter [employer] of Steps: 6   Home Layout:  Multi-level;Bed/bath upstairs Alternate Level Stairs-Number of Steps: 6   Bathroom Shower/Tub: Tub/shower unit Shower/tub characteristics: Architectural technologist: Standard Bathroom Accessibility: Yes How Accessible: Accessible via walker Home Equipment: None          Prior Functioning/Environment Level of Independence: Independent             OT Diagnosis: Generalized weakness;Acute pain   OT Problem List: Decreased strength;Decreased range of motion;Decreased activity  tolerance;Decreased knowledge of use of DME or AE;Decreased knowledge of precautions;Pain   OT Treatment/Interventions: Self-care/ADL training;DME and/or AE instruction;Therapeutic activities;Patient/family education    OT Goals(Current goals can be found in the care plan section) Acute Rehab OT Goals Patient Stated Goal: to go home OT Goal Formulation: With patient Time For Goal Achievement: 01/04/15 Potential to Achieve Goals: Good  OT Frequency: Min 2X/week   Barriers to D/C:            Co-evaluation              End of Session Equipment Utilized During Treatment: Gait belt;Rolling walker Nurse Communication: Mobility status;Other (comment) (Pt wishing to D/C today)  Activity Tolerance: Patient tolerated treatment well Patient left: in chair;with call bell/phone within reach;with family/visitor present   Time: 2426-8341 OT Time Calculation (min): 25 min Charges:  OT General Charges $OT Visit: 1 Procedure OT Evaluation $Initial OT Evaluation Tier I: 1 Procedure OT Treatments $Self Care/Home Management : 8-22 mins G-Codes:    Kandis Henry,HILLARY 2015-01-05, 10:35 AM   Maurie Boettcher, OTR/L  [phone] 01/05/2015

## 2014-12-28 NOTE — Evaluation (Signed)
Physical Therapy Evaluation Patient Details Name: Theresa Michael MRN: [id number] DOB: [birth year] Today's Date: 12/28/2014   History of Present Illness  s/p L TKA revision  Clinical Impression  Pt was assessed and has been a bit unsteady on the trip up stairs initially but may be from misjudging the step height.  Her plan is to go home with husband who is retired and will be readily available.  Her meds were significant just before this walk/session as pt had been vomiting.  Will anticipate that since otherwise she is needing mainly min guarding that husband can provide this help.  PT to follow with HHPT     Follow Up Recommendations Home health PT    Equipment Recommendations  Rolling walker with 5" wheels;3in1 (PT)    Recommendations for Other Services       Precautions / Restrictions Precautions Precautions: Knee Precaution Booklet Issued: Yes (comment) Precaution Comments: reviewed with pt who already had them previously Restrictions Weight Bearing Restrictions: No      Mobility  Bed Mobility Overal bed mobility: Modified Independent             General bed mobility comments: up when PT entered  Transfers Overall transfer level: Needs assistance Equipment used: Rolling walker (2 wheeled) Transfers: Sit to/from [employer] Sit to Stand: Supervision Stand pivot transfers: Supervision       General transfer comment: vc for hand placement  Ambulation/Gait Ambulation/Gait assistance: Supervision Ambulation Distance (Feet): 100 Feet Assistive device: Rolling walker (2 wheeled) Gait Pattern/deviations: Step-to pattern;Decreased dorsiflexion - right;Decreased dorsiflexion - left;Decreased stride length;Wide base of support;Drifts right/left Gait velocity: reduced Gait velocity interpretation: Below normal speed for age/gender General Gait Details: limited tolerance for wbing on LLE which is affecting stride length and stance  times  Stairs Stairs: Yes Stairs assistance: Min guard Stair Management: Two rails;Step to pattern;Forwards Number of Stairs: 6 General stair comments: pt struck the step with RLE hard on first attempt but may have been meds and her judgment  Wheelchair Mobility    Modified Rankin (Stroke Patients Only)       Balance Overall balance assessment: Needs assistance Sitting-balance support: Feet supported Sitting balance-Leahy Scale: Good   Postural control: Posterior lean Standing balance support: Bilateral upper extremity supported Standing balance-Leahy Scale: Fair Standing balance comment: needs walker for control of L knee which previously had buckled at times and is still regaining a level  of comfort for pt to feel secure                             Pertinent Vitals/Pain Pain Assessment: 0-10 Pain Score: 6  Pain Location: L knee Pain Descriptors / Indicators: Aching;Grimacing Pain Intervention(s): Limited activity within patient's tolerance;Monitored during session;Premedicated before session;Repositioned;Ice applied    Home Living Family/patient expects to be discharged to:: Private residence Living Arrangements: Spouse/significant other Available Help at Discharge: Family;Available 24 hours/day Type of Home: House Home Access: Stairs to enter Entrance Stairs-Rails: Can reach both Entrance Stairs-Number of Steps: 6 Home Layout: Multi-level;Bed/bath upstairs Home Equipment: None      Prior Function Level of Independence: Independent               Hand Dominance        Extremity/Trunk Assessment   Upper Extremity Assessment: Overall WFL for tasks assessed           Lower Extremity Assessment: LLE deficits/detail   LLE Deficits / Details: new TKA with edema  and limited ROM  Cervical / Trunk Assessment: Normal  Communication   Communication: No difficulties  Cognition Arousal/Alertness: Awake/alert Behavior During Therapy: WFL for  tasks assessed/performed Overall Cognitive Status: Within Functional Limits for tasks assessed                      General Comments General comments (skin integrity, edema, etc.): Pt has been able to walk on L knee without a walker but is having a bit less comfort with counting on it now, somewhat careful with it     Exercises Total Joint Exercises Ankle Circles/Pumps: Both;10 reps;AROM Quad Sets: AROM;Both;5 reps Heel Slides: AROM;Both;5 reps      Assessment/Plan    PT Assessment Patient needs continued PT services  PT Diagnosis Acute pain;Difficulty walking   PT Problem List Decreased strength;Decreased range of motion;Decreased activity tolerance;Decreased balance;Decreased mobility;Decreased coordination;Decreased safety awareness;Decreased skin integrity;Pain  PT Treatment Interventions Gait training;Stair training;DME instruction;Functional mobility training;Therapeutic activities;Therapeutic exercise;Neuromuscular re-education;Balance training;Patient/family education   PT Goals (Current goals can be found in the Care Plan section) Acute Rehab PT Goals Patient Stated Goal: to go home PT Goal Formulation: With patient Time For Goal Achievement: 01/11/15 Potential to Achieve Goals: Good    Frequency 7X/week   Barriers to discharge Inaccessible home environment      Co-evaluation               End of Session Equipment Utilized During Treatment: Gait belt Activity Tolerance: Patient tolerated treatment well Patient left: in chair;with call bell/phone within reach Nurse Communication: Mobility status         Time: 9935-7017 PT Time Calculation (min) (ACUTE ONLY): 30 min   Charges:   PT Evaluation $Initial PT Evaluation Tier I: 1 Procedure PT Treatments $Gait Training: 8-22 mins   PT G CodesRamond Dial 25-Jan-2015, 12:11 PM  Mee Hives, PT MS Acute Rehab Dept. Number: [phone]

## 2014-12-28 NOTE — Progress Notes (Signed)
Pt expressed desire to go home today. Called and discussed with Dr. Sharol Given. D/C planned for later this evening following PT evaluation.

## 2014-12-28 NOTE — Progress Notes (Signed)
Patient ID: Theresa Michael, female   DOB: 1961-05-03, 54 y.o.   MRN: [id number] Postoperative day one revision left total knee arthroplasty. Patient is alert and oriented this morning. She complains of some pain. Physical therapy progressive ambulation, patient wishes to discharge to home on Friday.

## 2015-01-11 ENCOUNTER — Encounter (HOSPITAL_COMMUNITY): Payer: Self-pay | Admitting: Orthopedic Surgery

## 2015-08-24 ENCOUNTER — Other Ambulatory Visit: Payer: Self-pay | Admitting: Orthopedic Surgery

## 2015-08-24 DIAGNOSIS — M545 Low back pain: Secondary | ICD-10-CM

## 2015-09-04 ENCOUNTER — Other Ambulatory Visit: Payer: BLUE CROSS/BLUE SHIELD

## 2015-09-17 ENCOUNTER — Ambulatory Visit
Admission: RE | Admit: 2015-09-17 | Discharge: 2015-09-17 | Disposition: A | Discharge status: Home / Self Care | Payer: BLUE CROSS/BLUE SHIELD | Source: Ambulatory Visit | Attending: Orthopedic Surgery | Admitting: Orthopedic Surgery

## 2015-09-17 DIAGNOSIS — M545 Low back pain: Secondary | ICD-10-CM

## 2016-10-24 ENCOUNTER — Telehealth (INDEPENDENT_AMBULATORY_CARE_PROVIDER_SITE_OTHER): Payer: Self-pay | Admitting: Physical Medicine and Rehabilitation

## 2016-10-24 NOTE — Telephone Encounter (Signed)
Patient is requesting another inj in her back/right hip.  Cb#: [phone]

## 2016-10-27 ENCOUNTER — Telehealth (INDEPENDENT_AMBULATORY_CARE_PROVIDER_SITE_OTHER): Payer: Self-pay | Admitting: Physical Medicine and Rehabilitation

## 2016-10-27 NOTE — Telephone Encounter (Signed)
Pt has active [employer] coverage/eff 12/09/15. No precert is required.

## 2016-10-27 NOTE — Telephone Encounter (Signed)
Pt scheduled and no precert required

## 2016-10-27 NOTE — Telephone Encounter (Signed)
If the patient is having right hip pain once again is very consistent to the way it was last time I would be willing to do the evaluation and right L4 transforaminal once again. However if it's different to her fits bilateral upright in office visit first.

## 2016-10-27 NOTE — Telephone Encounter (Signed)
If pain is similar especially if his right hip and leg. We could do even bowel and transforaminal epidural injection repeat on the same day. If it seems different to her orbits bilateral then I would probably do an office visit first

## 2016-11-10 ENCOUNTER — Ambulatory Visit (INDEPENDENT_AMBULATORY_CARE_PROVIDER_SITE_OTHER): Payer: BLUE CROSS/BLUE SHIELD | Admitting: Physical Medicine and Rehabilitation

## 2016-11-10 ENCOUNTER — Encounter (INDEPENDENT_AMBULATORY_CARE_PROVIDER_SITE_OTHER): Payer: Self-pay | Admitting: Physical Medicine and Rehabilitation

## 2016-11-10 VITALS — BP 135/81 | HR 58

## 2016-11-10 DIAGNOSIS — M5416 Radiculopathy, lumbar region: Secondary | ICD-10-CM | POA: Diagnosis not present

## 2016-11-10 DIAGNOSIS — M48062 Spinal stenosis, lumbar region with neurogenic claudication: Secondary | ICD-10-CM | POA: Diagnosis not present

## 2016-11-10 MED ORDER — LIDOCAINE HCL (PF) 1 % IJ SOLN
0.3300 mL | Freq: Once | INTRAMUSCULAR | Status: AC
  Administered 2016-11-10: 0.3 mL

## 2016-11-10 MED ORDER — METHYLPREDNISOLONE ACETATE 80 MG/ML IJ SUSP
80.0000 mg | Freq: Once | INTRAMUSCULAR | Status: AC
  Administered 2016-11-10: 80 mg

## 2016-11-10 NOTE — Patient Instructions (Signed)

## 2016-11-10 NOTE — Procedures (Signed)
Lumbosacral Transforaminal Epidural Steroid Injection - Infraneural Approach with Fluoroscopic Guidance  [name]      Date of Birth: 1961-07-31 MRN: [id number] PCP: Vickii Chafe, MD      Visit Date: 11/10/2016   Universal Protocol:    Date/Time: 11/10/1709:01 AM  Consent Given By: the patient  Position: PRONE   Additional Comments: Vital signs were monitored before and after the procedure. Patient was prepped and draped in the usual sterile fashion. The correct patient, procedure, and site was verified.   Injection Procedure Details:  Procedure Site One Meds Administered:  Meds ordered this encounter  Medications  . lidocaine (PF) (XYLOCAINE) 1 % injection 0.3 mL  . methylPREDNISolone acetate (DEPO-MEDROL) injection 80 mg      Laterality: Right  Location/Site:  L4-L5  Needle size: 22 G  Needle type: Spinal  Needle Placement: Transforaminal  Findings:  -Contrast Used: 1 mL iohexol 180 mg iodine/mL   -Comments: Excellent flow of contrast along the nerve and into the epidural space.  Procedure Details: After squaring off the end-plates of the desired vertebral level to get a true AP view, the C-arm was obliqued to the painful side so that the superior articulating process is positioned about 1/3 the length of the inferior endplate.  The needle was aimed toward the junction of the superior articular process and the transverse process of the inferior vertebrae. The needle's initial entry is in the lower third of the foramen through Kambin's triangle. The soft tissues overlying this target were infiltrated with 2-3 ml. of 1% Lidocaine without Epinephrine.  The spinal needle was then inserted and advanced toward the target using a "trajectory" view along the fluoroscope beam.  Under AP and lateral visualization, the needle was advanced so it did not puncture dura and did not traverse medially beyond the 6 o'clock position of the pedicle. Bi-planar  projections were used to confirm position. Aspiration was confirmed to be negative for CSF and/or blood. A 1-2 ml. volume of Isovue-250 was injected and flow of contrast was noted at each level. Radiographs were obtained for documentation purposes.   After attaining the desired flow of contrast documented above, a 0.5 to 1.0 ml test dose of 0.25% Marcaine was injected into each respective transforaminal space.  The patient was observed for 90 seconds post injection.  After no sensory deficits were reported, and normal lower extremity motor function was noted,   the above injectate was administered so that equal amounts of the injectate were placed at each foramen (level) into the transforaminal epidural space.   Additional Comments:  The patient tolerated the procedure well Dressing: Band-Aid    Post-procedure details: Patient was observed during the procedure. Post-procedure instructions were reviewed.  Patient left the clinic in stable condition.
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
## 2017-01-05 ENCOUNTER — Telehealth (INDEPENDENT_AMBULATORY_CARE_PROVIDER_SITE_OTHER): Payer: Self-pay | Admitting: Physical Medicine and Rehabilitation

## 2017-01-05 NOTE — Telephone Encounter (Signed)
ok 

## 2017-01-05 NOTE — Telephone Encounter (Signed)
Active bcbs coverage. No precert required.

## 2017-01-07 ENCOUNTER — Encounter (INDEPENDENT_AMBULATORY_CARE_PROVIDER_SITE_OTHER): Payer: Self-pay

## 2017-01-07 ENCOUNTER — Ambulatory Visit (INDEPENDENT_AMBULATORY_CARE_PROVIDER_SITE_OTHER): Payer: BLUE CROSS/BLUE SHIELD | Admitting: Physical Medicine and Rehabilitation

## 2017-01-07 ENCOUNTER — Ambulatory Visit (INDEPENDENT_AMBULATORY_CARE_PROVIDER_SITE_OTHER): Payer: Self-pay

## 2017-01-07 ENCOUNTER — Encounter (INDEPENDENT_AMBULATORY_CARE_PROVIDER_SITE_OTHER): Payer: Self-pay | Admitting: Physical Medicine and Rehabilitation

## 2017-01-07 VITALS — BP 136/82 | HR 72

## 2017-01-07 DIAGNOSIS — M5416 Radiculopathy, lumbar region: Secondary | ICD-10-CM | POA: Diagnosis not present

## 2017-01-07 MED ORDER — METHYLPREDNISOLONE ACETATE 80 MG/ML IJ SUSP
80.0000 mg | Freq: Once | INTRAMUSCULAR | Status: AC
  Administered 2017-01-07: 80 mg

## 2017-01-07 MED ORDER — LIDOCAINE HCL (PF) 1 % IJ SOLN
0.3300 mL | Freq: Once | INTRAMUSCULAR | Status: AC
  Administered 2017-01-07: 0.3 mL
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
## 2017-01-07 NOTE — Procedures (Signed)
Lumbosacral Transforaminal Epidural Steroid Injection - Infraneural Approach with Fluoroscopic Guidance  [name]      Date of Birth: 08-08-1961 MRN: [id number] PCP: Vickii Chafe, MD      Visit Date: 01/07/2017   Universal Protocol:    Date/Time: 01/31/181:10 PM  Consent Given By: the patient  Position: PRONE   Additional Comments: Vital signs were monitored before and after the procedure. Patient was prepped and draped in the usual sterile fashion. The correct patient, procedure, and site was verified.   Injection Procedure Details:  Procedure Site One Meds Administered:  Meds ordered this encounter  Medications  . lidocaine (PF) (XYLOCAINE) 1 % injection 0.3 mL  . methylPREDNISolone acetate (DEPO-MEDROL) injection 80 mg      Laterality: Right  Location/Site:  L4-L5  Needle size: 22 G  Needle type: Spinal  Needle Placement: Transforaminal  Findings:  -Contrast Used: 1 mL iohexol 180 mg iodine/mL   -Comments: Excellent flow of contrast along the nerve and into the epidural space.  Procedure Details: After squaring off the end-plates of the desired vertebral level to get a true AP view, the C-arm was obliqued to the painful side so that the superior articulating process is positioned about 1/3 the length of the inferior endplate.  The needle was aimed toward the junction of the superior articular process and the transverse process of the inferior vertebrae. The needle's initial entry is in the lower third of the foramen through Kambin's triangle. The soft tissues overlying this target were infiltrated with 2-3 ml. of 1% Lidocaine without Epinephrine.  The spinal needle was then inserted and advanced toward the target using a "trajectory" view along the fluoroscope beam.  Under AP and lateral visualization, the needle was advanced so it did not puncture dura and did not traverse medially beyond the 6 o'clock position of the pedicle. Bi-planar  projections were used to confirm position. Aspiration was confirmed to be negative for CSF and/or blood. A 1-2 ml. volume of Isovue-250 was injected and flow of contrast was noted at each level. Radiographs were obtained for documentation purposes.   After attaining the desired flow of contrast documented above, a 0.5 to 1.0 ml test dose of 0.25% Marcaine was injected into each respective transforaminal space.  The patient was observed for 90 seconds post injection.  After no sensory deficits were reported, and normal lower extremity motor function was noted,   the above injectate was administered so that equal amounts of the injectate were placed at each foramen (level) into the transforaminal epidural space.   Additional Comments:  The patient tolerated the procedure well No complications occurred Dressing: Band-Aid    Post-procedure details: Patient was observed during the procedure. Post-procedure instructions were reviewed.  Patient left the clinic in stable condition.

## 2017-01-07 NOTE — Patient Instructions (Signed)

## 2017-01-12 ENCOUNTER — Encounter (INDEPENDENT_AMBULATORY_CARE_PROVIDER_SITE_OTHER): Payer: BLUE CROSS/BLUE SHIELD | Admitting: Physical Medicine and Rehabilitation

## 2017-01-14 ENCOUNTER — Telehealth (INDEPENDENT_AMBULATORY_CARE_PROVIDER_SITE_OTHER): Payer: Self-pay | Admitting: Physical Medicine and Rehabilitation

## 2017-01-14 NOTE — Telephone Encounter (Signed)
Patient called and left a message saying that her injection on 01/07/17 has not worked. I called her back and left a message advising her that, as Dr. Ernestina Patches told her on the day of the injection, it could take longer than a week for the injection to begin working. I advised her to give it a few more days and then call us back if she still had no relief as it had only been 6 days since the injection when she called 01/13/17.

## 2017-01-20 ENCOUNTER — Telehealth (INDEPENDENT_AMBULATORY_CARE_PROVIDER_SITE_OTHER): Payer: Self-pay | Admitting: Physical Medicine and Rehabilitation

## 2017-01-20 DIAGNOSIS — M5416 Radiculopathy, lumbar region: Secondary | ICD-10-CM

## 2017-01-20 NOTE — Telephone Encounter (Signed)
Updated MRI if this is just worsening symptoms without much relief from the injection versus seeing a spine surgeon for evaluation. We can also regroup with a physical therapist.

## 2017-01-20 NOTE — Telephone Encounter (Signed)
Patient called and order placed.

## 2017-01-20 NOTE — Telephone Encounter (Signed)
Get preferred MRI locale and if claustrophobic then order lspine MRI without contrast. If claustrophobic then open MRI and let me know if sedation.

## 2017-01-23 ENCOUNTER — Ambulatory Visit
Admission: RE | Admit: 2017-01-23 | Discharge: 2017-01-23 | Disposition: A | Discharge status: Home / Self Care | Payer: BLUE CROSS/BLUE SHIELD | Source: Ambulatory Visit | Attending: Physical Medicine and Rehabilitation | Admitting: Physical Medicine and Rehabilitation

## 2017-01-23 DIAGNOSIS — M5416 Radiculopathy, lumbar region: Secondary | ICD-10-CM

## 2017-01-29 ENCOUNTER — Encounter (INDEPENDENT_AMBULATORY_CARE_PROVIDER_SITE_OTHER): Payer: BLUE CROSS/BLUE SHIELD | Admitting: Physical Medicine and Rehabilitation

## 2017-02-10 ENCOUNTER — Ambulatory Visit (INDEPENDENT_AMBULATORY_CARE_PROVIDER_SITE_OTHER): Payer: Self-pay

## 2017-02-10 ENCOUNTER — Ambulatory Visit (INDEPENDENT_AMBULATORY_CARE_PROVIDER_SITE_OTHER): Payer: BLUE CROSS/BLUE SHIELD | Admitting: Physical Medicine and Rehabilitation

## 2017-02-10 ENCOUNTER — Encounter (INDEPENDENT_AMBULATORY_CARE_PROVIDER_SITE_OTHER): Payer: Self-pay | Admitting: Physical Medicine and Rehabilitation

## 2017-02-10 VITALS — BP 118/74 | HR 81

## 2017-02-10 DIAGNOSIS — M5416 Radiculopathy, lumbar region: Secondary | ICD-10-CM | POA: Diagnosis not present

## 2017-02-10 DIAGNOSIS — R21 Rash and other nonspecific skin eruption: Secondary | ICD-10-CM | POA: Diagnosis not present

## 2017-02-10 DIAGNOSIS — M5116 Intervertebral disc disorders with radiculopathy, lumbar region: Secondary | ICD-10-CM

## 2017-02-10 MED ORDER — METHYLPREDNISOLONE ACETATE 80 MG/ML IJ SUSP
80.0000 mg | Freq: Once | INTRAMUSCULAR | Status: AC
  Administered 2017-02-10: 80 mg

## 2017-02-10 MED ORDER — LIDOCAINE HCL (PF) 1 % IJ SOLN
0.3300 mL | Freq: Once | INTRAMUSCULAR | Status: AC
  Administered 2017-02-10: 0.3 mL

## 2017-02-10 NOTE — Procedures (Signed)
Lumbar Epidural Steroid Injection - Interlaminar Approach with Fluoroscopic Guidance  [name]      Date of Birth: September 07, 1961 MRN: [id number] PCP: Vickii Chafe, MD      Visit Date: 02/10/2017   Universal Protocol:    Date/Time: 03/06/182:47 PM  Consent Given By: the patient  Position: PRONE  Additional Comments: Vital signs were monitored before and after the procedure. Patient was prepped and draped in the usual sterile fashion. The correct patient, procedure, and site was verified.   Injection Procedure Details:  Procedure Site One Meds Administered:  Meds ordered this encounter  Medications  . lidocaine (PF) (XYLOCAINE) 1 % injection 0.3 mL  . methylPREDNISolone acetate (DEPO-MEDROL) injection 80 mg     Laterality: Right  Location/Site:  L5-S1  Needle size: 20 G  Needle type: Tuohy  Needle Placement: Paramedian epidural  Findings:  -Contrast Used: 1 mL iohexol 180 mg iodine/mL   -Comments: Excellent flow of contrast into the epidural space.  Procedure Details: Using a paramedian approach from the side mentioned above, the region overlying the inferior lamina was localized under fluoroscopic visualization and the soft tissues overlying this structure were infiltrated with 4 ml. of 1% Lidocaine without Epinephrine. The Tuohy needle was inserted into the epidural space using a paramedian approach.   The epidural space was localized using loss of resistance along with lateral and bi-planar fluoroscopic views.  After negative aspirate for air, blood, and CSF, a 2 ml. volume of Isovue-250 was injected into the epidural space and the flow of contrast was observed. Radiographs were obtained for documentation purposes.    The injectate was administered into the level noted above.   Additional Comments:  The patient tolerated the procedure well Dressing: Band-Aid    Post-procedure details: Patient was observed during the  procedure. Post-procedure instructions were reviewed.  Patient left the clinic in stable condition.

## 2017-02-10 NOTE — Patient Instructions (Addendum)
Chlorhexidine  [employer] Discharge Instructions  *At any time if you have questions or concerns they can be answered by calling [phone]  All Patients: . You may experience an increase in your symptoms for the first 2 days (it can take 2 days to 2 weeks for the steroid/cortisone to have its maximal effect). . You may use ice to the site for the first 24 hours; 20 minutes on and 20 minutes off and may use heat after that time. . You may resume and continue your current pain medications. If you need a refill please contact the prescribing physician. . You may resume her regular medications if any were stopped for the procedure. . You may shower but no swimming, tub bath or Jacuzzi for 24 hours. . Please remove bandage after 4 hours. . You may resume light activities as tolerated. . If you had Spine Injection, you should not drive for the next 3 hours due to anesthetics used in the procedure. Please have someone drive for you.  *If you have had sedation, Valium, Xanax, or lorazepam: Do not drive or use public transportation for 24 hours, do not operating hazardous machinery or make important personal/business decisions for 24 hours.  POSSIBLE STEROID SIDE EFFECTS: If experienced these should only last for a short period. Change in menstrual flow  Edema in (swelling)  Increased appetite Skin flushing (redness)  Skin rash/acne  Thrush (oral) Vaginitis    Increased sweating  Depression Increased blood glucose levels Cramping and leg/calf  Euphoria (feeling happy)  POSSIBLE PROCEDURE SIDE EFFECTS: Please call our office if concerned. Increased pain Increased numbness/tingling  Headache Nausea/vomiting Hematoma (bruising/bleeding) Edema (swelling at the site) Weakness  Infection (red/drainage at site) Fever greater than 100.59F  *In the event of a headache after epidural steroid injection: Drink plenty of fluids, especially water and try to lay flat when possible. If the  headache does not get better after a few days or as always if concerned please call the office.
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
## 2017-07-02 ENCOUNTER — Ambulatory Visit (INDEPENDENT_AMBULATORY_CARE_PROVIDER_SITE_OTHER): Payer: BLUE CROSS/BLUE SHIELD | Admitting: Orthopedic Surgery

## 2017-07-02 ENCOUNTER — Ambulatory Visit (INDEPENDENT_AMBULATORY_CARE_PROVIDER_SITE_OTHER): Payer: BLUE CROSS/BLUE SHIELD

## 2017-07-02 ENCOUNTER — Encounter (INDEPENDENT_AMBULATORY_CARE_PROVIDER_SITE_OTHER): Payer: Self-pay | Admitting: Orthopedic Surgery

## 2017-07-02 VITALS — Ht 64.5 in | Wt 156.0 lb

## 2017-07-02 DIAGNOSIS — G8929 Other chronic pain: Secondary | ICD-10-CM

## 2017-07-02 DIAGNOSIS — M25562 Pain in left knee: Secondary | ICD-10-CM
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
## 2018-05-27 IMAGING — MR MR LUMBAR SPINE W/O CM
4 of 5 series · 25 of 48 positions shown · non-contrast
Comparison: 09/17/2015

CLINICAL DATA: Pt with recurrent low back pain and now having right
leg pain intermittently for 3-4 months. Pt has had steroid
injections on right and previously had problems with left side.

EXAM:
MRI LUMBAR SPINE WITHOUT CONTRAST
TECHNIQUE: Multiplanar, multisequence MR imaging of the lumbar spine was
performed. No intravenous contrast was administered.

[Series 4: T2 · sagittal · 4.0mm · 0.55mm/px · 6 of 13 slices shown (1 of 2)]
[im 1/13]
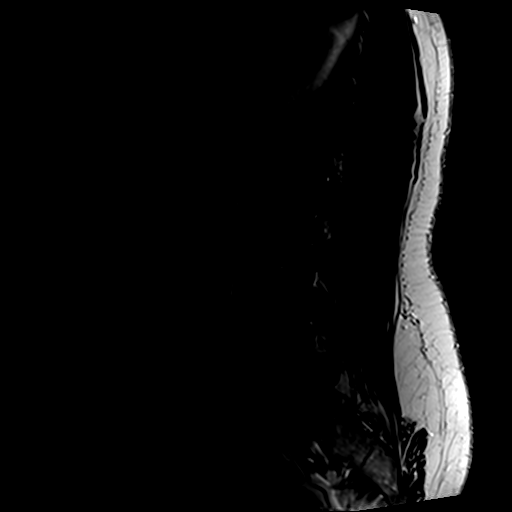
[im 3/13]
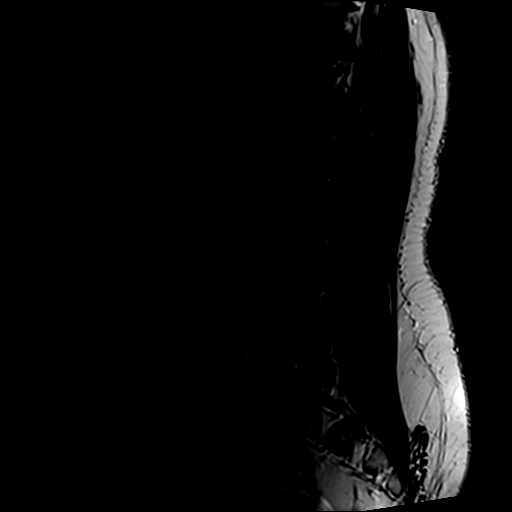
[im 5/13]
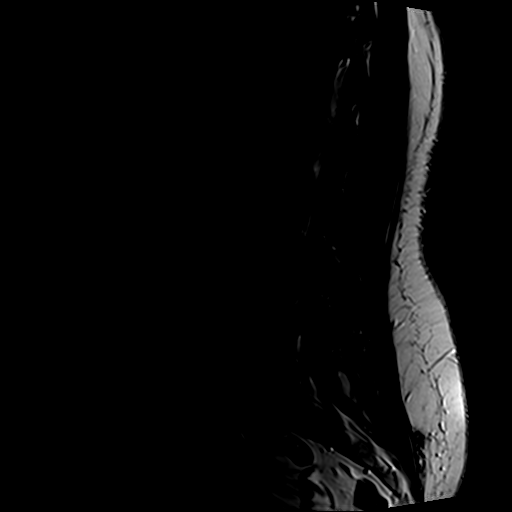
[im 8/13]
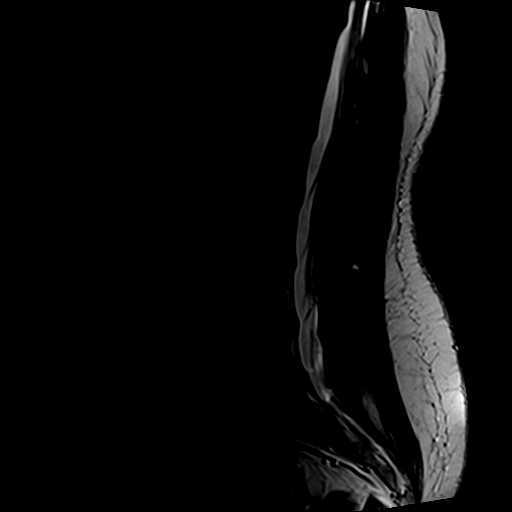
[im 10/13]
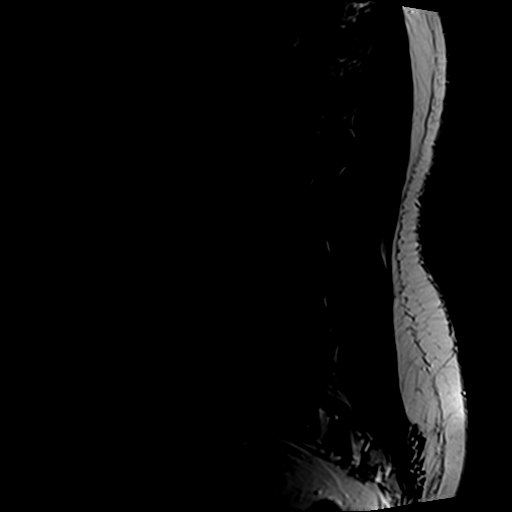
[im 13/13]
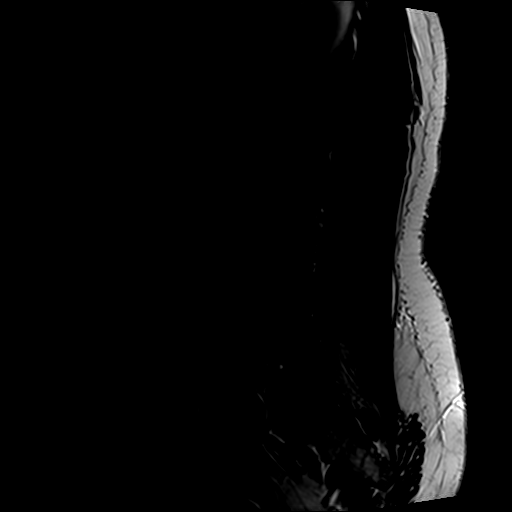

[Series 5: T1 · sagittal · 4.0mm · 0.55mm/px · 6 of 13 slices shown (1 of 2)]
[im 1/13]
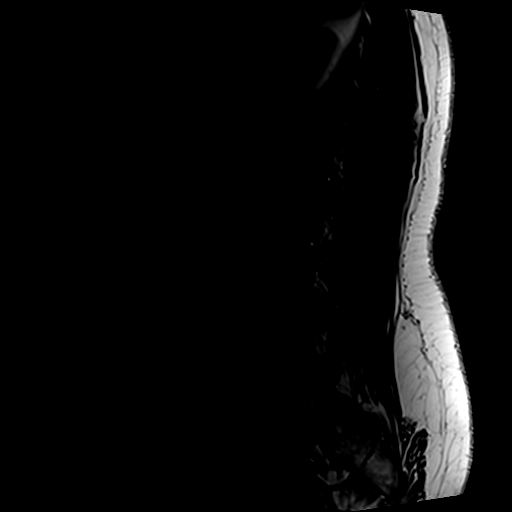
[im 3/13]
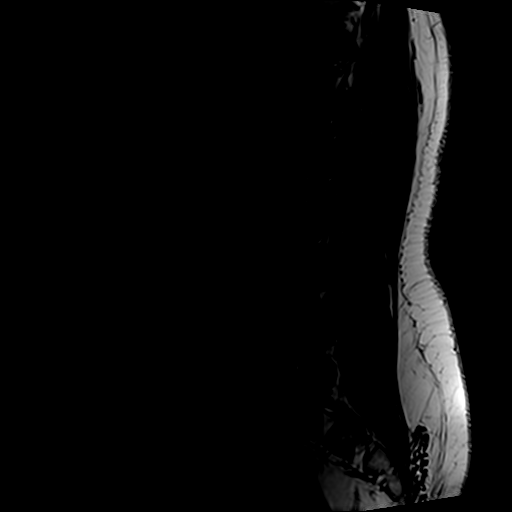
[im 5/13]
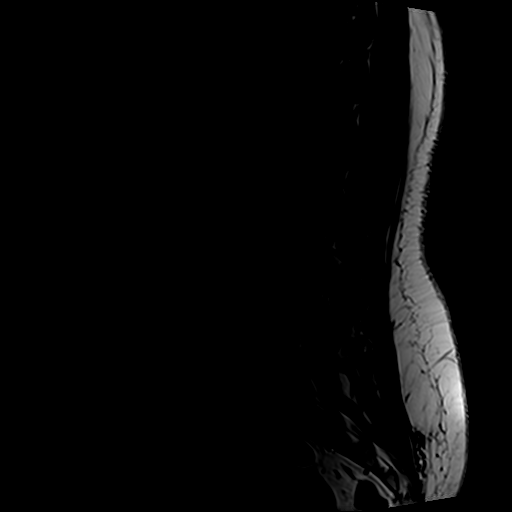
[im 8/13]
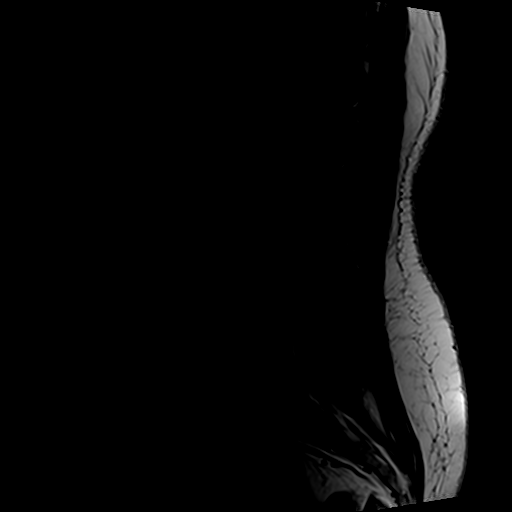
[im 10/13]
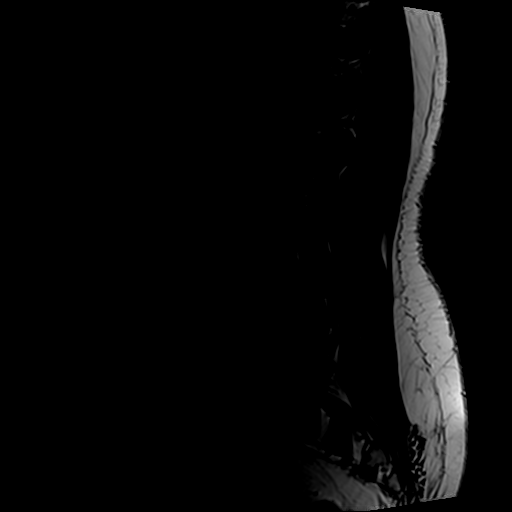
[im 13/13]
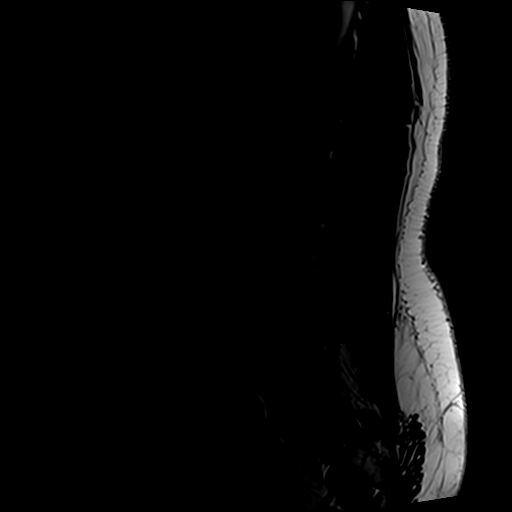

[Series 6: T2 · axial · 4.0mm · 0.70mm/px · z∈[-123,+53]mm · 9 of 33 slices shown (2 of 2)]
[im 1/33]
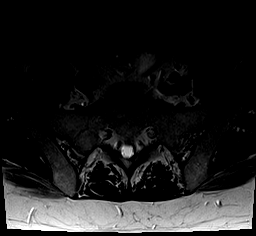
[im 5/33]
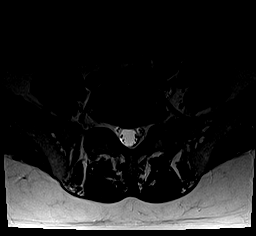
[im 10/33]
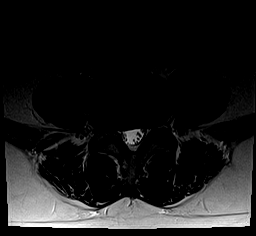
[im 14/33]
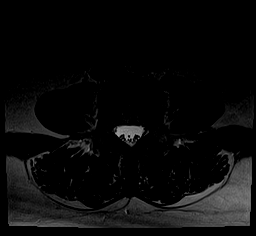
[im 17/33]
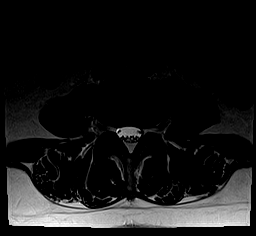
[im 19/33]
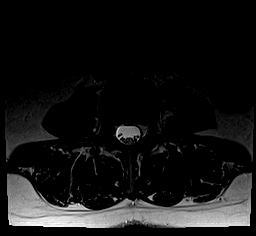
[im 23/33]
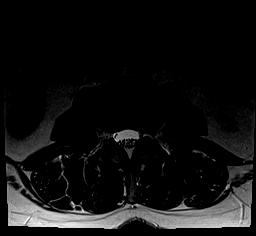
[im 28/33]
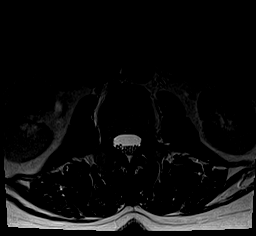
[im 33/33]
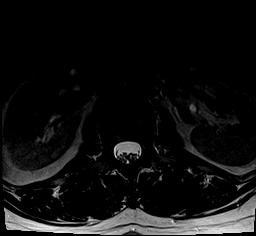

[Series 7: T1 · axial · 4.0mm · 0.35mm/px · z∈[-123,+28]mm · 4 of 33 slices shown (2 of 2)]
[im 1/33]
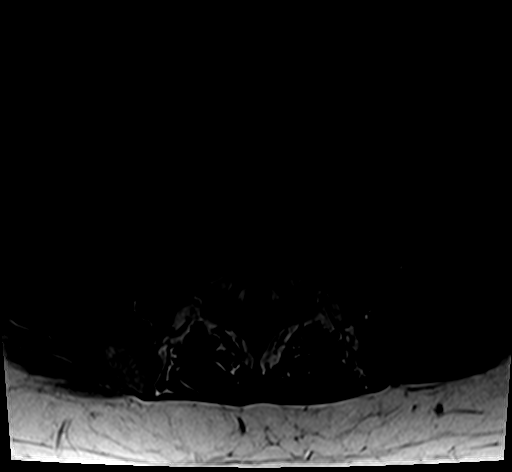
[im 5/33]
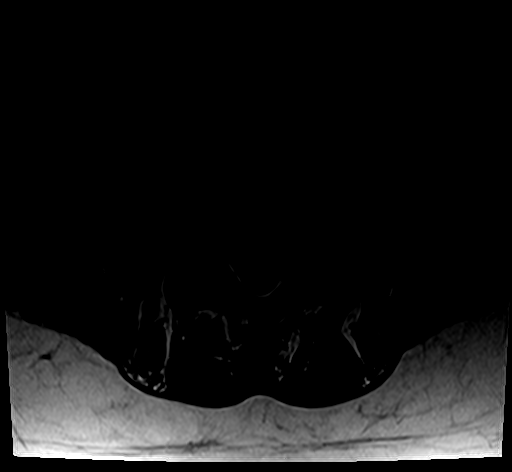
[im 17/33]
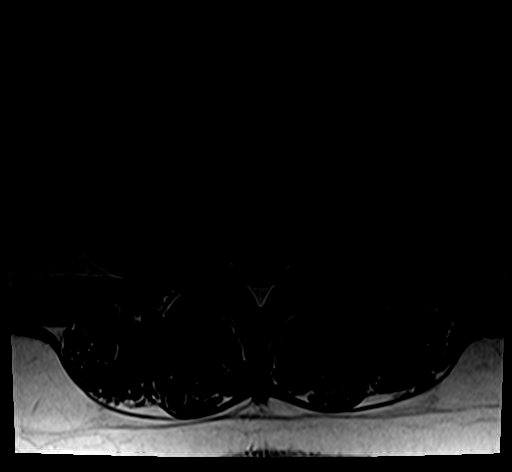
[im 28/33]
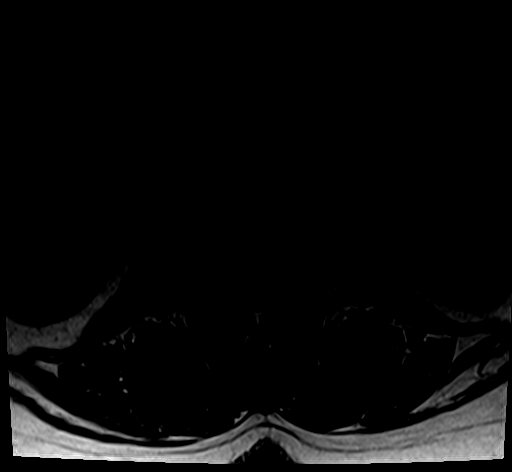

[25 of 48 positions shown; findings below may reference images not displayed]

FINDINGS: Segmentation:  Standard.

Alignment:  Physiologic.

Vertebrae:  No fracture, evidence of discitis, or bone lesion.

Conus medullaris: Extends to the T12 level and appears normal.

Paraspinal and other soft tissues: No paraspinal abnormality.

Disc levels:

Disc spaces: Degenerative disc disease mild disc height loss at
L4-5.

T12-L1: No significant disc bulge. No evidence of neural foraminal
stenosis. No central canal stenosis.

L1-L2: No significant disc bulge. No evidence of neural foraminal
stenosis. No central canal stenosis.

L2-L3: No significant disc bulge. No evidence of neural foraminal
stenosis. No central canal stenosis.

L3-L4: No significant disc protrusion. Left lateral annular fissure
abutting the left extraforaminal L3 nerve root. No evidence of
neural foraminal stenosis. No central canal stenosis.

L4-L5: Broad-based disc bulge. Moderate-severe right foraminal
stenosis. Mild left foraminal stenosis. Right lateral recess
stenosis. No central canal stenosis.

L5-S1: No significant disc bulge. Moderate left facet arthropathy.
Moderate left foraminal stenosis. No right foraminal stenosis. No
central canal stenosis.
IMPRESSION: 1. At L4-5 there is a broad-based disc bulge. Moderate-severe right
foraminal stenosis. Mild left foraminal stenosis. Right lateral
recess stenosis.
2. At L3-4 there is a left lateral annular fissure abutting the left
extraforaminal L3 nerve root.
3. At L5-S1 there is moderate left facet arthropathy. Moderate left
foraminal stenosis.

## 2018-06-29 ENCOUNTER — Ambulatory Visit (INDEPENDENT_AMBULATORY_CARE_PROVIDER_SITE_OTHER): Payer: BLUE CROSS/BLUE SHIELD | Admitting: Physical Medicine and Rehabilitation

## 2018-06-29 ENCOUNTER — Encounter (INDEPENDENT_AMBULATORY_CARE_PROVIDER_SITE_OTHER): Payer: Self-pay | Admitting: Physical Medicine and Rehabilitation

## 2018-06-29 VITALS — BP 110/74 | HR 68 | Temp 97.9°F | Ht 65.0 in | Wt 170.0 lb

## 2018-06-29 DIAGNOSIS — M5416 Radiculopathy, lumbar region: Secondary | ICD-10-CM

## 2018-06-29 DIAGNOSIS — M5116 Intervertebral disc disorders with radiculopathy, lumbar region: Secondary | ICD-10-CM

## 2018-06-29 DIAGNOSIS — M48062 Spinal stenosis, lumbar region with neurogenic claudication: Secondary | ICD-10-CM

## 2018-06-29 NOTE — Progress Notes (Signed)
 .  Numeric Pain Rating Scale and Functional Assessment Average Pain 6 Pain Right Now 5 My pain is intermittent, sharp and tingling Pain is worse with: walking, bending and some activites Pain improves with: therapy/exercise   In the last MONTH (on 0-10 scale) has pain interfered with the following?  1. General activity like being  able to carry out your everyday physical activities such as walking, climbing stairs, carrying groceries, or moving a chair?  Rating(4)  2. Relation with others like being able to carry out your usual social activities and roles such as  activities at home, at work and in your community. Rating(3)  3. Enjoyment of life such that you have  been bothered by emotional problems such as feeling anxious, depressed or irritable?  Rating(0)

## 2018-07-07 ENCOUNTER — Encounter (INDEPENDENT_AMBULATORY_CARE_PROVIDER_SITE_OTHER): Payer: Self-pay | Admitting: Physical Medicine and Rehabilitation

## 2018-07-07 ENCOUNTER — Ambulatory Visit (INDEPENDENT_AMBULATORY_CARE_PROVIDER_SITE_OTHER): Payer: Self-pay

## 2018-07-07 ENCOUNTER — Ambulatory Visit (INDEPENDENT_AMBULATORY_CARE_PROVIDER_SITE_OTHER): Payer: BLUE CROSS/BLUE SHIELD | Admitting: Physical Medicine and Rehabilitation

## 2018-07-07 VITALS — BP 118/76 | HR 61

## 2018-07-07 DIAGNOSIS — M48062 Spinal stenosis, lumbar region with neurogenic claudication: Secondary | ICD-10-CM

## 2018-07-07 DIAGNOSIS — M5116 Intervertebral disc disorders with radiculopathy, lumbar region: Secondary | ICD-10-CM | POA: Diagnosis not present

## 2018-07-07 DIAGNOSIS — M5416 Radiculopathy, lumbar region: Secondary | ICD-10-CM | POA: Diagnosis not present

## 2018-07-07 MED ORDER — METHYLPREDNISOLONE ACETATE 80 MG/ML IJ SUSP
80.0000 mg | Freq: Once | INTRAMUSCULAR | Status: AC
  Administered 2018-07-07: 80 mg

## 2018-07-07 NOTE — Patient Instructions (Signed)

## 2018-07-07 NOTE — Procedures (Signed)
Lumbar Facet Joint Intra-Articular Injection(s) with Fluoroscopic Guidance  [name]      Date of Birth: 13-Sep-1961 MRN: [id number] PCP: Stacie Glaze, DO      Visit Date: 07/07/2018   Universal Protocol:    Date/Time: 07/07/2018  Consent Given By: the patient  Position: PRONE   Additional Comments: Vital signs were monitored before and after the procedure. Patient was prepped and draped in the usual sterile fashion. The correct patient, procedure, and site was verified.   Injection Procedure Details:  Procedure Site One Meds Administered:  Meds ordered this encounter  Medications  . methylPREDNISolone acetate (DEPO-MEDROL) injection 80 mg     Laterality: Right  Location/Site:  L5-S1  Needle size: 22 guage  Needle type: Spinal  Needle Placement: Articular  Findings:  -Comments: Excellent flow of contrast into the epidural space.  Procedure Details: The fluoroscope beam is vertically oriented in AP, and the inferior recess is visualized beneath the lower pole of the inferior apophyseal process, which represents the target point for needle insertion. When direct visualization is difficult the target point is located at the medial projection of the vertebral pedicle. The region overlying each aforementioned target is locally anesthetized with a 1 to 2 ml. volume of 1% Lidocaine without Epinephrine.   The spinal needle was inserted into each of the above mentioned facet joints using biplanar fluoroscopic guidance. A 0.25 to 0.5 ml. volume of Isovue-250 was injected and a partial facet joint arthrogram was obtained. A single spot film was obtained of the resulting arthrogram.    One to 1.25 ml of the steroid/anesthetic solution was then injected into each of the facet joints noted above.   Additional Comments:  The patient tolerated the procedure well Dressing: Band-Aid    Post-procedure details: Patient was observed during the  procedure. Post-procedure instructions were reviewed.  Patient left the clinic in stable condition.

## 2018-07-07 NOTE — Progress Notes (Signed)
 .  Numeric Pain Rating Scale and Functional Assessment Average Pain 7   In the last MONTH (on 0-10 scale) has pain interfered with the following?  1. General activity like being  able to carry out your everyday physical activities such as walking, climbing stairs, carrying groceries, or moving a chair?  Rating(4)   +Driver, -BT, -Dye Allergies.  
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
## 2018-11-02 ENCOUNTER — Telehealth (INDEPENDENT_AMBULATORY_CARE_PROVIDER_SITE_OTHER): Payer: Self-pay | Admitting: Physical Medicine and Rehabilitation

## 2018-11-02 NOTE — Telephone Encounter (Signed)
Spoke with Cherita F from [place] and she states no pa is needed for [phone]. Reference#[id number]

## 2018-11-02 NOTE — Telephone Encounter (Signed)
Is auth needed for [phone]? Scheduled for 11/16/18 with driver and no blood thinners.

## 2018-11-02 NOTE — Telephone Encounter (Signed)
ok 

## 2018-11-16 ENCOUNTER — Ambulatory Visit (INDEPENDENT_AMBULATORY_CARE_PROVIDER_SITE_OTHER): Payer: BLUE CROSS/BLUE SHIELD | Admitting: Physical Medicine and Rehabilitation

## 2018-11-16 ENCOUNTER — Ambulatory Visit (INDEPENDENT_AMBULATORY_CARE_PROVIDER_SITE_OTHER): Payer: Self-pay

## 2018-11-16 ENCOUNTER — Encounter (INDEPENDENT_AMBULATORY_CARE_PROVIDER_SITE_OTHER): Payer: Self-pay | Admitting: Physical Medicine and Rehabilitation

## 2018-11-16 VITALS — BP 134/79 | HR 62 | Temp 98.3°F

## 2018-11-16 DIAGNOSIS — M5416 Radiculopathy, lumbar region: Secondary | ICD-10-CM

## 2018-11-16 MED ORDER — METHYLPREDNISOLONE ACETATE 80 MG/ML IJ SUSP
80.0000 mg | Freq: Once | INTRAMUSCULAR | Status: AC
  Administered 2018-11-16: 80 mg

## 2018-11-16 NOTE — Patient Instructions (Signed)

## 2018-11-16 NOTE — Progress Notes (Signed)
 .  Numeric Pain Rating Scale and Functional Assessment Average Pain 8   In the last MONTH (on 0-10 scale) has pain interfered with the following?  1. General activity like being  able to carry out your everyday physical activities such as walking, climbing stairs, carrying groceries, or moving a chair?  Rating(7)   +Driver, -BT, -Dye Allergies.  

## 2018-12-27 NOTE — Procedures (Signed)
Lumbar Epidural Steroid Injection - Interlaminar Approach with Fluoroscopic Guidance  [name]      Date of Birth: 1961-03-25 MRN: [id number] PCP: Stacie Glaze, DO      Visit Date: 11/16/2018   Universal Protocol:     Consent Given By: the patient  Position: PRONE  Additional Comments: Vital signs were monitored before and after the procedure. Patient was prepped and draped in the usual sterile fashion. The correct patient, procedure, and site was verified.   Injection Procedure Details:  Procedure Site One Meds Administered:  Meds ordered this encounter  Medications  . methylPREDNISolone acetate (DEPO-MEDROL) injection 80 mg     Laterality: Right  Location/Site:  L5-S1  Needle size: 20 G  Needle type: Tuohy  Needle Placement: Paramedian epidural  Findings:   -Comments: Excellent flow of contrast into the epidural space.  Procedure Details: Using a paramedian approach from the side mentioned above, the region overlying the inferior lamina was localized under fluoroscopic visualization and the soft tissues overlying this structure were infiltrated with 4 ml. of 1% Lidocaine without Epinephrine. The Tuohy needle was inserted into the epidural space using a paramedian approach.   The epidural space was localized using loss of resistance along with lateral and bi-planar fluoroscopic views.  After negative aspirate for air, blood, and CSF, a 2 ml. volume of Isovue-250 was injected into the epidural space and the flow of contrast was observed. Radiographs were obtained for documentation purposes.    The injectate was administered into the level noted above.   Additional Comments:  The patient tolerated the procedure well Dressing: Band-Aid    Post-procedure details: Patient was observed during the procedure. Post-procedure instructions were reviewed.  Patient left the clinic in stable condition.

## 2018-12-27 NOTE — Progress Notes (Signed)
Theresa Michael - 58 y.o. female MRN [id number]  Date of birth: [birth year]  Office Visit Note: Visit Date: 11/16/2018 PCP: Stacie Glaze, DO Referred by: Stacie Glaze, DO  Subjective: Chief Complaint  Patient presents with  . Lower Back - Pain  . Right Leg - Pain  . Left Leg - Pain   HPI:  Theresa Michael is a 58 y.o. female who comes in today For planned repeat right L5-S1 interlaminar epidural steroid injection.  She reports 3 to 4 weeks of worsening low back and somewhat radicular pain bilaterally.  She is tried and failed conservative care and those notes can be reviewed.  Prior injection in July gave her quite a bit of relief up until just recently.  MRI does show facet arthropathy particular L4-5 and L5-S1 with some foraminal narrowing at each level.  She has more of an annular tear higher up but no high-grade central stenosis.  Would repeat the epidural given the fact that it did help.  Exam today was benign with no focal deficit.  ROS Otherwise per HPI.  Assessment & Plan: Visit Diagnoses:  1. Lumbar radiculopathy     Plan: No additional findings.   Meds & Orders:  Meds ordered this encounter  Medications  . methylPREDNISolone acetate (DEPO-MEDROL) injection 80 mg    Orders Placed This Encounter  Procedures  . XR C-ARM NO REPORT  . Epidural Steroid injection    Follow-up: Return if symptoms worsen or fail to improve.   Procedures: No procedures performed  Lumbar Epidural Steroid Injection - Interlaminar Approach with Fluoroscopic Guidance  [name]      Date of Birth: 10-Dec-1960 MRN: [id number] PCP: Stacie Glaze, DO      Visit Date: 11/16/2018   Universal Protocol:     Consent Given By: the patient  Position: PRONE  Additional Comments: Vital signs were monitored before and after the procedure. Patient was prepped and draped in the usual sterile fashion. The correct patient, procedure, and site was  verified.   Injection Procedure Details:  Procedure Site One Meds Administered:  Meds ordered this encounter  Medications  . methylPREDNISolone acetate (DEPO-MEDROL) injection 80 mg     Laterality: Right  Location/Site:  L5-S1  Needle size: 20 G  Needle type: Tuohy  Needle Placement: Paramedian epidural  Findings:   -Comments: Excellent flow of contrast into the epidural space.  Procedure Details: Using a paramedian approach from the side mentioned above, the region overlying the inferior lamina was localized under fluoroscopic visualization and the soft tissues overlying this structure were infiltrated with 4 ml. of 1% Lidocaine without Epinephrine. The Tuohy needle was inserted into the epidural space using a paramedian approach.   The epidural space was localized using loss of resistance along with lateral and bi-planar fluoroscopic views.  After negative aspirate for air, blood, and CSF, a 2 ml. volume of Isovue-250 was injected into the epidural space and the flow of contrast was observed. Radiographs were obtained for documentation purposes.    The injectate was administered into the level noted above.   Additional Comments:  The patient tolerated the procedure well Dressing: Band-Aid    Post-procedure details: Patient was observed during the procedure. Post-procedure instructions were reviewed.  Patient left the clinic in stable condition.   Clinical History: Lspine MRI 01/23/2017 IMPRESSION: 1. At L4-5 there is a broad-based disc bulge. Moderate-severe right foraminal stenosis. Mild left foraminal stenosis. Right lateral recess stenosis. 2. At L3-4 there is a left  lateral annular fissure abutting the left extraforaminal L3 nerve root. 3. At L5-S1 there is moderate left facet arthropathy. Moderate left foraminal stenosis.     Objective:  VS:  HT:    WT:   BMI:     BP:134/79  HR:62bpm  TEMP:98.3 F (36.8 C)(Oral)  RESP:  Physical Exam  Ortho  Exam Imaging: No results found.
